# Patient Record
Sex: Female | Born: 1965 | Race: Black or African American | Hispanic: No | Marital: Single | Smoking: Former smoker
Health system: Southern US, Community
[De-identification: ages and names within clinical notes are randomized; demographics above are authoritative.]

## PROBLEM LIST (undated history)

## (undated) DIAGNOSIS — K219 Gastro-esophageal reflux disease without esophagitis: Secondary | ICD-10-CM

## (undated) DIAGNOSIS — F32A Depression, unspecified: Secondary | ICD-10-CM

## (undated) DIAGNOSIS — Z5189 Encounter for other specified aftercare: Secondary | ICD-10-CM

## (undated) DIAGNOSIS — M810 Age-related osteoporosis without current pathological fracture: Secondary | ICD-10-CM

## (undated) DIAGNOSIS — F419 Anxiety disorder, unspecified: Secondary | ICD-10-CM

## (undated) DIAGNOSIS — R7303 Prediabetes: Secondary | ICD-10-CM

## (undated) DIAGNOSIS — E119 Type 2 diabetes mellitus without complications: Secondary | ICD-10-CM

## (undated) DIAGNOSIS — J449 Chronic obstructive pulmonary disease, unspecified: Secondary | ICD-10-CM

## (undated) DIAGNOSIS — D649 Anemia, unspecified: Secondary | ICD-10-CM

## (undated) DIAGNOSIS — F329 Major depressive disorder, single episode, unspecified: Secondary | ICD-10-CM

## (undated) DIAGNOSIS — B019 Varicella without complication: Secondary | ICD-10-CM

## (undated) DIAGNOSIS — F1910 Other psychoactive substance abuse, uncomplicated: Secondary | ICD-10-CM

## (undated) HISTORY — DX: Anxiety disorder, unspecified: F41.9

## (undated) HISTORY — DX: Chronic obstructive pulmonary disease, unspecified: J44.9

## (undated) HISTORY — DX: Encounter for other specified aftercare: Z51.89

## (undated) HISTORY — DX: Other psychoactive substance abuse, uncomplicated: F19.10

## (undated) HISTORY — DX: Anemia, unspecified: D64.9

## (undated) HISTORY — DX: Varicella without complication: B01.9

## (undated) HISTORY — DX: Age-related osteoporosis without current pathological fracture: M81.0

## (undated) HISTORY — PX: COLPOSCOPY: SHX161

## (undated) HISTORY — DX: Major depressive disorder, single episode, unspecified: F32.9

## (undated) HISTORY — DX: Prediabetes: R73.03

## (undated) HISTORY — DX: Type 2 diabetes mellitus without complications: E11.9

## (undated) HISTORY — DX: Gastro-esophageal reflux disease without esophagitis: K21.9

## (undated) HISTORY — DX: Depression, unspecified: F32.A

---

## 2000-04-02 ENCOUNTER — Emergency Department (HOSPITAL_COMMUNITY): Admission: EM | Admit: 2000-04-02 | Discharge: 2000-04-02 | Payer: Self-pay | Admitting: Emergency Medicine

## 2000-09-29 ENCOUNTER — Emergency Department (HOSPITAL_COMMUNITY): Admission: EM | Admit: 2000-09-29 | Discharge: 2000-09-29 | Payer: Self-pay | Admitting: Emergency Medicine

## 2001-08-04 ENCOUNTER — Encounter: Payer: Self-pay | Admitting: Family Medicine

## 2001-08-04 ENCOUNTER — Encounter: Admission: RE | Admit: 2001-08-04 | Discharge: 2001-08-04 | Payer: Self-pay | Admitting: Family Medicine

## 2001-08-16 ENCOUNTER — Encounter: Payer: Self-pay | Admitting: Internal Medicine

## 2001-08-16 ENCOUNTER — Emergency Department (HOSPITAL_COMMUNITY): Admission: EM | Admit: 2001-08-16 | Discharge: 2001-08-16 | Payer: Self-pay | Admitting: Internal Medicine

## 2001-12-23 ENCOUNTER — Encounter: Payer: Self-pay | Admitting: Emergency Medicine

## 2001-12-23 ENCOUNTER — Emergency Department (HOSPITAL_COMMUNITY): Admission: EM | Admit: 2001-12-23 | Discharge: 2001-12-23 | Payer: Self-pay | Admitting: Emergency Medicine

## 2004-04-10 ENCOUNTER — Other Ambulatory Visit: Admission: RE | Admit: 2004-04-10 | Discharge: 2004-04-10 | Payer: Self-pay | Admitting: Obstetrics and Gynecology

## 2004-11-27 ENCOUNTER — Ambulatory Visit: Payer: Self-pay | Admitting: Internal Medicine

## 2004-12-11 ENCOUNTER — Ambulatory Visit: Payer: Self-pay | Admitting: Internal Medicine

## 2004-12-26 ENCOUNTER — Ambulatory Visit: Payer: Self-pay | Admitting: Internal Medicine

## 2005-01-19 ENCOUNTER — Ambulatory Visit: Payer: Self-pay | Admitting: Internal Medicine

## 2005-01-29 ENCOUNTER — Ambulatory Visit: Payer: Self-pay | Admitting: Internal Medicine

## 2008-09-08 ENCOUNTER — Ambulatory Visit: Payer: Self-pay | Admitting: Nurse Practitioner

## 2008-09-08 DIAGNOSIS — Z720 Tobacco use: Secondary | ICD-10-CM | POA: Insufficient documentation

## 2008-09-08 DIAGNOSIS — S46819A Strain of other muscles, fascia and tendons at shoulder and upper arm level, unspecified arm, initial encounter: Secondary | ICD-10-CM

## 2008-09-08 DIAGNOSIS — S43499A Other sprain of unspecified shoulder joint, initial encounter: Secondary | ICD-10-CM

## 2008-10-08 ENCOUNTER — Ambulatory Visit: Payer: Self-pay | Admitting: Nurse Practitioner

## 2008-10-08 ENCOUNTER — Encounter (INDEPENDENT_AMBULATORY_CARE_PROVIDER_SITE_OTHER): Payer: Self-pay | Admitting: Nurse Practitioner

## 2008-10-08 DIAGNOSIS — N760 Acute vaginitis: Secondary | ICD-10-CM | POA: Insufficient documentation

## 2008-10-08 LAB — CONVERTED CEMR LAB
ALT: <8 units/L (ref 0–35)
AST: 15 units/L (ref 0–37)
Albumin: 4 g/dL (ref 3.5–5.2)
Alkaline Phosphatase: 68 units/L (ref 39–117)
BUN: 17 mg/dL (ref 6–23)
Basophils Absolute: 0 10*3/uL (ref 0.0–0.1)
Basophils Relative: 0 % (ref 0–1)
Bilirubin Urine: NEGATIVE
Blood in Urine, dipstick: NEGATIVE
CO2: 24 meq/L (ref 19–32)
Calcium: 8.9 mg/dL (ref 8.4–10.5)
Chlamydia, DNA Probe: NEGATIVE
Chloride: 110 meq/L (ref 96–112)
Cholesterol: 157 mg/dL (ref 0–200)
Creatinine, Ser: 0.85 mg/dL (ref 0.40–1.20)
Eosinophils Absolute: 0.2 10*3/uL (ref 0.0–0.7)
Eosinophils Relative: 4 % (ref 0–5)
GC Probe Amp, Genital: NEGATIVE
Glucose, Bld: 91 mg/dL (ref 70–99)
Glucose, Urine, Semiquant: NEGATIVE
HCT: 37.4 % (ref 36.0–46.0)
HDL: 44 mg/dL (ref >39)
Hemoglobin: 12.1 g/dL (ref 12.0–15.0)
KOH Prep: NEGATIVE
Ketones, urine, test strip: NEGATIVE
LDL Cholesterol: 100 mg/dL — ABNORMAL HIGH (ref 0–99)
Lymphocytes Relative: 31 % (ref 12–46)
Lymphs Abs: 1.9 10*3/uL (ref 0.7–4.0)
MCHC: 32.4 g/dL (ref 30.0–36.0)
MCV: 90.1 fL (ref 78.0–100.0)
Monocytes Absolute: 0.5 10*3/uL (ref 0.1–1.0)
Monocytes Relative: 9 % (ref 3–12)
Neutro Abs: 3.6 10*3/uL (ref 1.7–7.7)
Neutrophils Relative %: 57 % (ref 43–77)
Nitrite: NEGATIVE
Platelets: 231 10*3/uL (ref 150–400)
Potassium: 4.6 meq/L (ref 3.5–5.3)
Protein, U semiquant: NEGATIVE
RBC: 4.15 M/uL (ref 3.87–5.11)
RDW: 15.4 % (ref 11.5–15.5)
Sodium: 143 meq/L (ref 135–145)
Specific Gravity, Urine: >=1.03
TSH: 2.311 microintl units/mL (ref 0.350–4.50)
Total Bilirubin: 0.6 mg/dL (ref 0.3–1.2)
Total CHOL/HDL Ratio: 3.6
Total Protein: 7.1 g/dL (ref 6.0–8.3)
Triglycerides: 64 mg/dL (ref <150)
Urobilinogen, UA: 0.2
VLDL: 13 mg/dL (ref 0–40)
WBC: 6.3 10*3/uL (ref 4.0–10.5)
pH: 6

## 2008-10-11 ENCOUNTER — Encounter (INDEPENDENT_AMBULATORY_CARE_PROVIDER_SITE_OTHER): Payer: Self-pay | Admitting: Nurse Practitioner

## 2008-10-11 ENCOUNTER — Telehealth (INDEPENDENT_AMBULATORY_CARE_PROVIDER_SITE_OTHER): Payer: Self-pay | Admitting: Nurse Practitioner

## 2008-10-22 ENCOUNTER — Ambulatory Visit (HOSPITAL_COMMUNITY): Admission: RE | Admit: 2008-10-22 | Discharge: 2008-10-22 | Payer: Self-pay | Admitting: Internal Medicine

## 2009-08-23 ENCOUNTER — Emergency Department (HOSPITAL_COMMUNITY): Admission: EM | Admit: 2009-08-23 | Discharge: 2009-08-23 | Payer: Self-pay | Admitting: Emergency Medicine

## 2009-08-25 ENCOUNTER — Ambulatory Visit: Payer: Self-pay | Admitting: Nurse Practitioner

## 2009-08-25 DIAGNOSIS — J329 Chronic sinusitis, unspecified: Secondary | ICD-10-CM | POA: Insufficient documentation

## 2009-08-25 DIAGNOSIS — K143 Hypertrophy of tongue papillae: Secondary | ICD-10-CM

## 2009-10-11 ENCOUNTER — Ambulatory Visit (HOSPITAL_COMMUNITY): Admission: RE | Admit: 2009-10-11 | Discharge: 2009-10-11 | Payer: Self-pay | Admitting: Internal Medicine

## 2009-10-19 ENCOUNTER — Ambulatory Visit: Payer: Self-pay | Admitting: Nurse Practitioner

## 2009-10-19 ENCOUNTER — Encounter (INDEPENDENT_AMBULATORY_CARE_PROVIDER_SITE_OTHER): Payer: Self-pay | Admitting: Nurse Practitioner

## 2009-10-19 DIAGNOSIS — H6120 Impacted cerumen, unspecified ear: Secondary | ICD-10-CM

## 2009-10-19 LAB — CONVERTED CEMR LAB
ALT: 13 units/L (ref 0–35)
BUN: 15 mg/dL (ref 6–23)
Basophils Relative: 1 % (ref 0–1)
Bilirubin Urine: NEGATIVE
Blood in Urine, dipstick: NEGATIVE
CO2: 24 meq/L (ref 19–32)
Calcium: 9.7 mg/dL (ref 8.4–10.5)
Chloride: 106 meq/L (ref 96–112)
Cholesterol: 189 mg/dL (ref 0–200)
Creatinine, Ser: 0.84 mg/dL (ref 0.40–1.20)
Eosinophils Absolute: 0.1 10*3/uL (ref 0.0–0.7)
Eosinophils Relative: 2 % (ref 0–5)
Glucose, Bld: 85 mg/dL (ref 70–99)
Glucose, Urine, Semiquant: NEGATIVE
HCT: 36.1 % (ref 36.0–46.0)
HDL: 56 mg/dL (ref >39)
KOH Prep: NEGATIVE
Ketones, urine, test strip: NEGATIVE
Lymphs Abs: 2.7 10*3/uL (ref 0.7–4.0)
MCHC: 33 g/dL (ref 30.0–36.0)
MCV: 88.3 fL (ref 78.0–100.0)
Monocytes Absolute: 0.4 10*3/uL (ref 0.1–1.0)
Monocytes Relative: 7 % (ref 3–12)
Neutrophils Relative %: 48 % (ref 43–77)
Nitrite: NEGATIVE
OCCULT 1: NEGATIVE
Protein, U semiquant: NEGATIVE
RBC: 4.09 M/uL (ref 3.87–5.11)
Specific Gravity, Urine: 1.025
Total Bilirubin: 0.5 mg/dL (ref 0.3–1.2)
Total CHOL/HDL Ratio: 3.4
Triglycerides: 45 mg/dL (ref <150)
Urobilinogen, UA: 0.2
VLDL: 9 mg/dL (ref 0–40)
WBC Urine, dipstick: NEGATIVE
WBC: 6.2 10*3/uL (ref 4.0–10.5)
pH: 5

## 2009-10-20 ENCOUNTER — Encounter (INDEPENDENT_AMBULATORY_CARE_PROVIDER_SITE_OTHER): Payer: Self-pay | Admitting: Nurse Practitioner

## 2009-10-24 ENCOUNTER — Ambulatory Visit: Payer: Self-pay | Admitting: Nurse Practitioner

## 2010-03-30 ENCOUNTER — Ambulatory Visit: Payer: Self-pay | Admitting: Nurse Practitioner

## 2010-03-30 DIAGNOSIS — L83 Acanthosis nigricans: Secondary | ICD-10-CM

## 2010-03-30 DIAGNOSIS — L0100 Impetigo, unspecified: Secondary | ICD-10-CM

## 2010-04-24 ENCOUNTER — Ambulatory Visit: Payer: Self-pay | Admitting: Physician Assistant

## 2010-04-24 ENCOUNTER — Encounter (INDEPENDENT_AMBULATORY_CARE_PROVIDER_SITE_OTHER): Payer: Self-pay | Admitting: Nurse Practitioner

## 2010-04-24 DIAGNOSIS — L259 Unspecified contact dermatitis, unspecified cause: Secondary | ICD-10-CM

## 2010-04-24 DIAGNOSIS — K029 Dental caries, unspecified: Secondary | ICD-10-CM | POA: Insufficient documentation

## 2011-01-23 NOTE — Letter (Signed)
Summary: PT INFORMATION SHEET  PT INFORMATION SHEET   Imported By: Arta Bruce 06/21/2010 14:51:14  _____________________________________________________________________  External Attachment:    Type:   Image     Comment:   External Document

## 2011-01-23 NOTE — Assessment & Plan Note (Signed)
Summary: tright hand infection//gk   Vital Signs:  Patient profile:   45 year old female Menstrual status:  regular Height:      61 inches Weight:      210 pounds BMI:     39.82 Temp:     97.9 degrees F oral Pulse rate:   78 / minute Pulse rhythm:   regular Resp:     18 per minute BP sitting:   126 / 85  (left arm) Cuff size:   large  Vitals Entered By: [place] Shannon (Apr 24, 2010 11:54 AM) CC: pt is here for right hand infection on thumb... Is Patient Diabetic? No Pain Assessment Patient in pain? no       Does patient need assistance? Functional Status Self care Ambulation Normal   Primary Care Provider:  Lehman Prom FNP  CC:  pt is here for right hand infection on thumb....  History of Present Illness: Here for rash on right thumb.  Dorma Russell, NP recently and given mupirocin for impetigo.  Patient notes rash seemed to get better.  But, she started to notice pruritus again a few days ago.  The pruritus started before the rash broke out.  She has been wearing latex gloves at work.  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  She denies having this before she was initially seen.  She denies any fevers.  She does have allergies and she usually takes OTC zyrtec.  She has been taking benadryl with this and it does help the itching.  She seems to have worsening of symptoms at bedtime.    She would also like a dental referral.  She has had one tooth fall out recently and has some molars that are starting to get loose as well.  No facial pain.  Current Medications (verified): 1)  Ibuprofen 800 Mg Tabs (Ibuprofen) .Marland Kitchen.. 1 Tablet By Mouth Two Times A Day As Needed For Pain 2)  Bactroban 2 % Oint (Mupirocin) .... Apply Three Times A Day To Affected Area For 5 Days  Allergies (verified): No Known Drug Allergies  Physical Exam  General:  alert, well-developed, and well-nourished.   Head:  normocephalic and atraumatic.   Mouth:  poor dentition and teeth missing.   Neck:  supple.   Lungs:   normal breath sounds.   Heart:  normal rate and regular rhythm.   Neurologic:  alert & oriented X3 and cranial nerves II-XII intact.   Skin:  maculopapular plaque on proximal phalanx of right thumb some excoriation noted no honey crusted lesions noted no other lesions noted at elbows or other hand  Psych:  normally interactive.     Impression & Recommendations:  Problem # 1:  ECZEMA (ICD-692.9)  suspect she had secondary infection when she initially presented and mupirocin seemed to help with h/o allergic rhinitis she would be at risk for eczema does not look infected today start triamcinolone x 2 weeks discussed using soaps without perfumes or dyes can use Aveeno for moisturizer f/u as needed  Her updated medication list for this problem includes:    Triamcinolone Acetonide 0.1 % Oint (Triamcinolone acetonide) .Marland Kitchen... Apply two times a day for one week; then apply once daily for one week; then stop  Problem # 2:  DENTAL CARIES (ICD-521.00)  Orders: Dental Referral (Dentist)  Complete Medication List: 1)  Ibuprofen 800 Mg Tabs (Ibuprofen) .Marland Kitchen.. 1 tablet by mouth two times a day as needed for pain 2)  Bactroban 2 % Oint (Mupirocin) .... Apply  three times a day to affected area for 5 days 3)  Triamcinolone Acetonide 0.1 % Oint (Triamcinolone acetonide) .... Apply two times a day for one week; then apply once daily for one week; then stop  Patient Instructions: 1)  Use the triamcinolone two times a day for a week, then once daily for a week, then stop. 2)  If rash gets worse, stop the triamcinolone and call us. 3)  Keep area clean.  If you begin to notice the yellow crusting or increased redness or yellowish drainage like you had before, you can use the mupirocin again.  Just do not apply it at the same time as the Triamcinolone. 4)  We will put in a dental referral for you. Prescriptions: TRIAMCINOLONE ACETONIDE 0.1 % OINT (TRIAMCINOLONE ACETONIDE) apply two times a day for one  week; then apply once daily for one week; then stop  #30 grams x 1   Entered and Authorized by:   Tereso Newcomer PA-C   Signed by:   Tereso Newcomer PA-C on 04/24/2010   Method used:   Print then Give to Patient   RxID:   [id number]

## 2011-01-23 NOTE — Assessment & Plan Note (Signed)
Summary: Acute - Impetigo   Vital Signs:  Patient profile:   45 year old female Menstrual status:  regular Weight:      206.7 pounds Temp:     97.6 degrees F oral Pulse rate:   86 / minute Pulse rhythm:   regular Resp:     20 per minute BP sitting:   120 / 82  (left arm) Cuff size:   large  Vitals Entered By: Levon Hedger (March 30, 2010 3:16 PM) CC: irritation on right thumb x 1 month ago...dark spot on elbow Is Patient Diabetic? No  Does patient need assistance? Functional Status Self care Ambulation Normal   CC:  irritation on right thumb x 1 month ago...dark spot on elbow.  History of Present Illness:  Pt into the office with complaints of bumps on her right thumb  started 1 month ago no progression over the past month but still present no other body parts affected Denies any trauma or insect bites.   Does not use gloves when washing dishes or household cleaning Puritis and she has been scratching the areas with her long fingernails no erythema blistered and crusted appearance she has been applying over the counter antibiotic ointment to the area again without improvement Pt is employed in a day care.  right elbow - discoloration with right elbow.   just noticed a few weeks ago. she has tried to clean the area but has realized that area is not due to hygiene. no pain  no puritis just appearance is the main concern  Habits & Providers  Alcohol-Tobacco-Diet     Alcohol drinks/day: <1     Alcohol Counseling: not indicated; patient does not drink     Tobacco Status: current     Tobacco Counseling: to quit use of tobacco products     Cigarette Packs/Day: 3 cigs     Year Started: quit 12/08 and restarted 4/09  Exercise-Depression-Behavior     Does Patient Exercise: no     Exercise Counseling: to improve exercise regimen     Have you felt down or hopeless? no     Have you felt little pleasure in things? no     Depression Counseling: not indicated;  screening negative for depression     Drug Use: past     Seat Belt Use: 100     Sun Exposure: occasionally  Allergies (verified): No Known Drug Allergies  Review of Systems Resp:  Denies cough. GI:  Denies abdominal pain, nausea, and vomiting. Derm:  Complains of rash.  Physical Exam  General:  alert.  obese Head:  normocephalic.   Eyes:  glasses Msk:  up to the exam table Neurologic:  alert & oriented X3.   Skin:  right thumb DIP - crusted lesions  right elbow - velvety, black appearance not present on left elbow Psych:  Oriented X3.     Impression & Recommendations:  Problem # 1:  IMPETIGO (ICD-684) handout given pt to apply ointment to affected area wear gloves when doing household chores  Problem # 2:  ACANTHOSIS NIGRICANS (ICD-701.2) handout given advised pt this is not due to hygiene  Problem # 3:  OBESITY (ICD-278.00) weight gain since last visit advised pt that she need to increase her physical activity  Complete Medication List: 1)  Ibuprofen 800 Mg Tabs (Ibuprofen) .Marland Kitchen.. 1 tablet by mouth two times a day as needed for pain 2)  Bactroban 2 % Oint (Mupirocin) .... Apply three times a day to affected area  for 5 days  Patient Instructions: 1)  Read handouts 2)  Right thumb - impetigo.  Apply ointment to affected areas until healed. 3)  Wear gloves when doing household chores 4)  Follow up as needed or sooner if symptoms continue Prescriptions: BACTROBAN 2 % OINT (MUPIROCIN) Apply three times a day to affected area for 5 days  #15gm x 0   Entered and Authorized by:   Lehman Prom FNP   Signed by:   Lehman Prom FNP on 03/30/2010   Method used:   Print then Give to Patient   RxID:   [id number]

## 2011-01-23 NOTE — Letter (Signed)
Summary: DENTAL REFERRAL  DENTAL REFERRAL   Imported By: Arta Bruce 06/21/2010 14:46:42  _____________________________________________________________________  External Attachment:    Type:   Image     Comment:   External Document

## 2013-07-08 ENCOUNTER — Other Ambulatory Visit (INDEPENDENT_AMBULATORY_CARE_PROVIDER_SITE_OTHER): Payer: No Typology Code available for payment source

## 2013-07-08 ENCOUNTER — Ambulatory Visit (INDEPENDENT_AMBULATORY_CARE_PROVIDER_SITE_OTHER): Payer: No Typology Code available for payment source | Admitting: Internal Medicine

## 2013-07-08 ENCOUNTER — Encounter: Payer: Self-pay | Admitting: Internal Medicine

## 2013-07-08 VITALS — BP 118/82 | HR 80 | Temp 98.1°F | Resp 16 | Ht 59.0 in | Wt 199.0 lb

## 2013-07-08 DIAGNOSIS — F17200 Nicotine dependence, unspecified, uncomplicated: Secondary | ICD-10-CM

## 2013-07-08 DIAGNOSIS — Z0000 Encounter for general adult medical examination without abnormal findings: Secondary | ICD-10-CM | POA: Insufficient documentation

## 2013-07-08 DIAGNOSIS — Z1231 Encounter for screening mammogram for malignant neoplasm of breast: Secondary | ICD-10-CM | POA: Insufficient documentation

## 2013-07-08 DIAGNOSIS — L259 Unspecified contact dermatitis, unspecified cause: Secondary | ICD-10-CM

## 2013-07-08 LAB — LIPID PANEL
Cholesterol: 188 mg/dL (ref 0–200)
LDL Cholesterol: 125 mg/dL — ABNORMAL HIGH (ref 0–99)
Triglycerides: 80 mg/dL (ref 0.0–149.0)

## 2013-07-08 LAB — CBC WITH DIFFERENTIAL/PLATELET
Basophils Absolute: 0.1 10*3/uL (ref 0.0–0.1)
Eosinophils Absolute: 0.1 10*3/uL (ref 0.0–0.7)
HCT: 37.7 % (ref 36.0–46.0)
Hemoglobin: 12.5 g/dL (ref 12.0–15.0)
Lymphs Abs: 1.6 10*3/uL (ref 0.7–4.0)
MCHC: 33.1 g/dL (ref 30.0–36.0)
MCV: 88.8 fl (ref 78.0–100.0)
Neutro Abs: 4.7 10*3/uL (ref 1.4–7.7)
RDW: 14.5 % (ref 11.5–14.6)

## 2013-07-08 LAB — URINALYSIS, ROUTINE W REFLEX MICROSCOPIC
Hgb urine dipstick: NEGATIVE
Total Protein, Urine: NEGATIVE
Urine Glucose: NEGATIVE

## 2013-07-08 LAB — COMPREHENSIVE METABOLIC PANEL
ALT: 11 U/L (ref 0–35)
AST: 14 U/L (ref 0–37)
Creatinine, Ser: 0.8 mg/dL (ref 0.4–1.2)
GFR: 103.78 mL/min (ref >60.00)
Total Bilirubin: 0.4 mg/dL (ref 0.3–1.2)

## 2013-07-08 MED ORDER — TRIAMCINOLONE ACETONIDE 0.5 % EX CREA: TOPICAL_CREAM | Freq: Three times a day (TID) | CUTANEOUS | Status: DC

## 2013-07-08 NOTE — Progress Notes (Signed)
Subjective:    Patient ID: Kylie Brennan, female    DOB: 1966-11-17, 48 y.o.   MRN: [id number]  Rash This is a chronic problem. The current episode started more than 1 month ago. The problem is unchanged. The affected locations include the neck, left arm and right arm. The rash is characterized by itchiness and dryness. She was exposed to nothing. Pertinent negatives include no anorexia, congestion, cough, diarrhea, eye pain, facial edema, fatigue, fever, joint pain, nail changes, rhinorrhea, shortness of breath, sore throat or vomiting. Past treatments include antihistamine. The treatment provided mild relief. Her past medical history is significant for eczema. There is no history of allergies, asthma or varicella.      Review of Systems  Constitutional: Negative.  Negative for fever, chills, diaphoresis, activity change, appetite change, fatigue and unexpected weight change.  HENT: Negative.  Negative for congestion, sore throat and rhinorrhea.   Eyes: Negative.  Negative for pain.  Respiratory: Negative.  Negative for cough, chest tightness, shortness of breath, wheezing and stridor.   Cardiovascular: Negative.  Negative for chest pain, palpitations and leg swelling.  Gastrointestinal: Negative.  Negative for nausea, vomiting, abdominal pain, diarrhea, blood in stool and anorexia.  Endocrine: Negative.   Genitourinary: Negative.   Musculoskeletal: Negative.  Negative for joint pain.  Skin: Positive for rash. Negative for nail changes, color change, pallor and wound.  Allergic/Immunologic: Negative.   Neurological: Negative.   Hematological: Negative.  Negative for adenopathy. Does not bruise/bleed easily.  Psychiatric/Behavioral: Negative.        Objective:   Physical Exam  Vitals reviewed. Constitutional: She is oriented to person, place, and time. She appears well-developed and well-nourished. No distress.  HENT:  Head: Normocephalic and atraumatic.  Mouth/Throat: Oropharynx is  clear and moist. No oropharyngeal exudate.  Eyes: Conjunctivae are normal. Right eye exhibits no discharge. Left eye exhibits no discharge. No scleral icterus.  Neck: Normal range of motion. Neck supple. No JVD present. No tracheal deviation present. No thyromegaly present.  Cardiovascular: Normal rate, regular rhythm, normal heart sounds and intact distal pulses.  Exam reveals no gallop and no friction rub.   No murmur heard. Pulmonary/Chest: Effort normal and breath sounds normal. No stridor. No respiratory distress. She has no wheezes. She has no rales. She exhibits no tenderness.  Abdominal: Soft. Bowel sounds are normal. She exhibits no distension and no mass. There is no tenderness. There is no rebound and no guarding.  Musculoskeletal: Normal range of motion. She exhibits no edema and no tenderness.  Lymphadenopathy:    She has no cervical adenopathy.  Neurological: She is oriented to person, place, and time.  Skin: Skin is warm, dry and intact. Rash noted. No lesion and no purpura noted. Rash is papular. Rash is not macular, not maculopapular, not nodular, not pustular, not vesicular and not urticarial. She is not diaphoretic. No cyanosis or erythema. No pallor. Nails show no clubbing.  She has rare scattered scaly papules over the back of her neck and over both upper arms  Psychiatric: She has a normal mood and affect. Her behavior is normal. Judgment and thought content normal.     Lab Results  Component Value Date   WBC 6.2 10/19/2009   HGB 11.9* 10/19/2009   HCT 36.1 10/19/2009   PLT 258 10/19/2009   GLUCOSE 85 10/19/2009   CHOL 189 10/19/2009   TRIG 45 10/19/2009   HDL 56 10/19/2009   LDLCALC 124* 10/19/2009   ALT 13 10/19/2009   AST 16  10/19/2009   NA 141 10/19/2009   K 4.9 10/19/2009   CL 106 10/19/2009   CREATININE 0.84 10/19/2009   BUN 15 10/19/2009   CO2 24 10/19/2009   TSH 2.407 10/19/2009       Assessment & Plan:

## 2013-07-08 NOTE — Patient Instructions (Signed)
Preventive Care for Adults, Female A healthy lifestyle and preventive care can promote health and wellness. Preventive health guidelines for women include the following key practices.  A routine yearly physical is a good way to check with your caregiver about your health and preventive screening. It is a chance to share any concerns and updates on your health, and to receive a thorough exam.  Visit your dentist for a routine exam and preventive care every 6 months. Brush your teeth twice a day and floss once a day. Good oral hygiene prevents tooth decay and gum disease.  The frequency of eye exams is based on your age, health, family medical history, use of contact lenses, and other factors. Follow your caregiver's recommendations for frequency of eye exams.  Eat a healthy diet. Foods like vegetables, fruits, whole grains, low-fat dairy products, and lean protein foods contain the nutrients you need without too many calories. Decrease your intake of foods high in solid fats, added sugars, and salt. Eat the right amount of calories for you.Get information about a proper diet from your caregiver, if necessary.  Regular physical exercise is one of the most important things you can do for your health. Most adults should get at least 150 minutes of moderate-intensity exercise (any activity that increases your heart rate and causes you to sweat) each week. In addition, most adults need muscle-strengthening exercises on 2 or more days a week.  Maintain a healthy weight. The body mass index (BMI) is a screening tool to identify possible weight problems. It provides an estimate of body fat based on height and weight. Your caregiver can help determine your BMI, and can help you achieve or maintain a healthy weight.For adults 20 years and older:  A BMI below 18.5 is considered underweight.  A BMI of 18.5 to 24.9 is normal.  A BMI of 25 to 29.9 is considered overweight.  A BMI of 30 and above is  considered obese.  Maintain normal blood lipids and cholesterol levels by exercising and minimizing your intake of saturated fat. Eat a balanced diet with plenty of fruit and vegetables. Blood tests for lipids and cholesterol should begin at age 20 and be repeated every 5 years. If your lipid or cholesterol levels are high, you are over 50, or you are at high risk for heart disease, you may need your cholesterol levels checked more frequently.Ongoing high lipid and cholesterol levels should be treated with medicines if diet and exercise are not effective.  If you smoke, find out from your caregiver how to quit. If you do not use tobacco, do not start.  If you are pregnant, do not drink alcohol. If you are breastfeeding, be very cautious about drinking alcohol. If you are not pregnant and choose to drink alcohol, do not exceed 1 drink per day. One drink is considered to be 12 ounces (355 mL) of beer, 5 ounces (148 mL) of wine, or 1.5 ounces (44 mL) of liquor.  Avoid use of street drugs. Do not share needles with anyone. Ask for help if you need support or instructions about stopping the use of drugs.  High blood pressure causes heart disease and increases the risk of stroke. Your blood pressure should be checked at least every 1 to 2 years. Ongoing high blood pressure should be treated with medicines if weight loss and exercise are not effective.  If you are 55 to 46 years old, ask your caregiver if you should take aspirin to prevent strokes.  Diabetes   screening involves taking a blood sample to check your fasting blood sugar level. This should be done once every 3 years, after age 45, if you are within normal weight and without risk factors for diabetes. Testing should be considered at a younger age or be carried out more frequently if you are overweight and have at least 1 risk factor for diabetes.  Breast cancer screening is essential preventive care for women. You should practice "breast  self-awareness." This means understanding the normal appearance and feel of your breasts and may include breast self-examination. Any changes detected, no matter how small, should be reported to a caregiver. Women in their 20s and 30s should have a clinical breast exam (CBE) by a caregiver as part of a regular health exam every 1 to 3 years. After age 40, women should have a CBE every year. Starting at age 40, women should consider having a mammography (breast X-ray test) every year. Women who have a family history of breast cancer should talk to their caregiver about genetic screening. Women at a high risk of breast cancer should talk to their caregivers about having magnetic resonance imaging (MRI) and a mammography every year.  The Pap test is a screening test for cervical cancer. A Pap test can show cell changes on the cervix that might become cervical cancer if left untreated. A Pap test is a procedure in which cells are obtained and examined from the lower end of the uterus (cervix).  Women should have a Pap test starting at age 21.  Between ages 21 and 29, Pap tests should be repeated every 2 years.  Beginning at age 30, you should have a Pap test every 3 years as long as the past 3 Pap tests have been normal.  Some women have medical problems that increase the chance of getting cervical cancer. Talk to your caregiver about these problems. It is especially important to talk to your caregiver if a new problem develops soon after your last Pap test. In these cases, your caregiver may recommend more frequent screening and Pap tests.  The above recommendations are the same for women who have or have not gotten the vaccine for human papillomavirus (HPV).  If you had a hysterectomy for a problem that was not cancer or a condition that could lead to cancer, then you no longer need Pap tests. Even if you no longer need a Pap test, a regular exam is a good idea to make sure no other problems are  starting.  If you are between ages 65 and 70, and you have had normal Pap tests going back 10 years, you no longer need Pap tests. Even if you no longer need a Pap test, a regular exam is a good idea to make sure no other problems are starting.  If you have had past treatment for cervical cancer or a condition that could lead to cancer, you need Pap tests and screening for cancer for at least 20 years after your treatment.  If Pap tests have been discontinued, risk factors (such as a new sexual partner) need to be reassessed to determine if screening should be resumed.  The HPV test is an additional test that may be used for cervical cancer screening. The HPV test looks for the virus that can cause the cell changes on the cervix. The cells collected during the Pap test can be tested for HPV. The HPV test could be used to screen women aged 30 years and older, and should   be used in women of any age who have unclear Pap test results. After the age of 30, women should have HPV testing at the same frequency as a Pap test.  Colorectal cancer can be detected and often prevented. Most routine colorectal cancer screening begins at the age of 50 and continues through age 75. However, your caregiver may recommend screening at an earlier age if you have risk factors for colon cancer. On a yearly basis, your caregiver may provide home test kits to check for hidden blood in the stool. Use of a small camera at the end of a tube, to directly examine the colon (sigmoidoscopy or colonoscopy), can detect the earliest forms of colorectal cancer. Talk to your caregiver about this at age 50, when routine screening begins. Direct examination of the colon should be repeated every 5 to 10 years through age 75, unless early forms of pre-cancerous polyps or small growths are found.  Hepatitis C blood testing is recommended for all people born from 1945 through 1965 and any individual with known risks for hepatitis C.  Practice  safe sex. Use condoms and avoid high-risk sexual practices to reduce the spread of sexually transmitted infections (STIs). STIs include gonorrhea, chlamydia, syphilis, trichomonas, herpes, HPV, and human immunodeficiency virus (HIV). Herpes, HIV, and HPV are viral illnesses that have no cure. They can result in disability, cancer, and death. Sexually active women aged 25 and younger should be checked for chlamydia. Older women with new or multiple partners should also be tested for chlamydia. Testing for other STIs is recommended if you are sexually active and at increased risk.  Osteoporosis is a disease in which the bones lose minerals and strength with aging. This can result in serious bone fractures. The risk of osteoporosis can be identified using a bone density scan. Women ages 65 and over and women at risk for fractures or osteoporosis should discuss screening with their caregivers. Ask your caregiver whether you should take a calcium supplement or vitamin D to reduce the rate of osteoporosis.  Menopause can be associated with physical symptoms and risks. Hormone replacement therapy is available to decrease symptoms and risks. You should talk to your caregiver about whether hormone replacement therapy is right for you.  Use sunscreen with sun protection factor (SPF) of 30 or more. Apply sunscreen liberally and repeatedly throughout the day. You should seek shade when your shadow is shorter than you. Protect yourself by wearing long sleeves, pants, a wide-brimmed hat, and sunglasses year round, whenever you are outdoors.  Once a month, do a whole body skin exam, using a mirror to look at the skin on your back. Notify your caregiver of new moles, moles that have irregular borders, moles that are larger than a pencil eraser, or moles that have changed in shape or color.  Stay current with required immunizations.  Influenza. You need a dose every fall (or winter). The composition of the flu vaccine  changes each year, so being vaccinated once is not enough.  Pneumococcal polysaccharide. You need 1 to 2 doses if you smoke cigarettes or if you have certain chronic medical conditions. You need 1 dose at age 65 (or older) if you have never been vaccinated.  Tetanus, diphtheria, pertussis (Tdap, Td). Get 1 dose of Tdap vaccine if you are younger than age 65, are over 65 and have contact with an infant, are a healthcare worker, are pregnant, or simply want to be protected from whooping cough. After that, you need a Td   booster dose every 10 years. Consult your caregiver if you have not had at least 3 tetanus and diphtheria-containing shots sometime in your life or have a deep or dirty wound.  HPV. You need this vaccine if you are a woman age 26 or younger. The vaccine is given in 3 doses over 6 months.  Measles, mumps, rubella (MMR). You need at least 1 dose of MMR if you were born in 1957 or later. You may also need a second dose.  Meningococcal. If you are age 19 to 21 and a first-year college student living in a residence hall, or have one of several medical conditions, you need to get vaccinated against meningococcal disease. You may also need additional booster doses.  Zoster (shingles). If you are age 60 or older, you should get this vaccine.  Varicella (chickenpox). If you have never had chickenpox or you were vaccinated but received only 1 dose, talk to your caregiver to find out if you need this vaccine.  Hepatitis A. You need this vaccine if you have a specific risk factor for hepatitis A virus infection or you simply wish to be protected from this disease. The vaccine is usually given as 2 doses, 6 to 18 months apart.  Hepatitis B. You need this vaccine if you have a specific risk factor for hepatitis B virus infection or you simply wish to be protected from this disease. The vaccine is given in 3 doses, usually over 6 months. Preventive Services / Frequency Ages 19 to 39  Blood  pressure check.** / Every 1 to 2 years.  Lipid and cholesterol check.** / Every 5 years beginning at age 20.  Clinical breast exam.** / Every 3 years for women in their 20s and 30s.  Pap test.** / Every 2 years from ages 21 through 29. Every 3 years starting at age 30 through age 65 or 70 with a history of 3 consecutive normal Pap tests.  HPV screening.** / Every 3 years from ages 30 through ages 65 to 70 with a history of 3 consecutive normal Pap tests.  Hepatitis C blood test.** / For any individual with known risks for hepatitis C.  Skin self-exam. / Monthly.  Influenza immunization.** / Every year.  Pneumococcal polysaccharide immunization.** / 1 to 2 doses if you smoke cigarettes or if you have certain chronic medical conditions.  Tetanus, diphtheria, pertussis (Tdap, Td) immunization. / A one-time dose of Tdap vaccine. After that, you need a Td booster dose every 10 years.  HPV immunization. / 3 doses over 6 months, if you are 26 and younger.  Measles, mumps, rubella (MMR) immunization. / You need at least 1 dose of MMR if you were born in 1957 or later. You may also need a second dose.  Meningococcal immunization. / 1 dose if you are age 19 to 21 and a first-year college student living in a residence hall, or have one of several medical conditions, you need to get vaccinated against meningococcal disease. You may also need additional booster doses.  Varicella immunization.** / Consult your caregiver.  Hepatitis A immunization.** / Consult your caregiver. 2 doses, 6 to 18 months apart.  Hepatitis B immunization.** / Consult your caregiver. 3 doses usually over 6 months. Ages 40 to 64  Blood pressure check.** / Every 1 to 2 years.  Lipid and cholesterol check.** / Every 5 years beginning at age 20.  Clinical breast exam.** / Every year after age 40.  Mammogram.** / Every year beginning at age 40   and continuing for as long as you are in good health. Consult with your  caregiver.  Pap test.** / Every 3 years starting at age 30 through age 65 or 70 with a history of 3 consecutive normal Pap tests.  HPV screening.** / Every 3 years from ages 30 through ages 65 to 70 with a history of 3 consecutive normal Pap tests.  Fecal occult blood test (FOBT) of stool. / Every year beginning at age 50 and continuing until age 75. You may not need to do this test if you get a colonoscopy every 10 years.  Flexible sigmoidoscopy or colonoscopy.** / Every 5 years for a flexible sigmoidoscopy or every 10 years for a colonoscopy beginning at age 50 and continuing until age 75.  Hepatitis C blood test.** / For all people born from 1945 through 1965 and any individual with known risks for hepatitis C.  Skin self-exam. / Monthly.  Influenza immunization.** / Every year.  Pneumococcal polysaccharide immunization.** / 1 to 2 doses if you smoke cigarettes or if you have certain chronic medical conditions.  Tetanus, diphtheria, pertussis (Tdap, Td) immunization.** / A one-time dose of Tdap vaccine. After that, you need a Td booster dose every 10 years.  Measles, mumps, rubella (MMR) immunization. / You need at least 1 dose of MMR if you were born in 1957 or later. You may also need a second dose.  Varicella immunization.** / Consult your caregiver.  Meningococcal immunization.** / Consult your caregiver.  Hepatitis A immunization.** / Consult your caregiver. 2 doses, 6 to 18 months apart.  Hepatitis B immunization.** / Consult your caregiver. 3 doses, usually over 6 months. Ages 65 and over  Blood pressure check.** / Every 1 to 2 years.  Lipid and cholesterol check.** / Every 5 years beginning at age 20.  Clinical breast exam.** / Every year after age 40.  Mammogram.** / Every year beginning at age 40 and continuing for as long as you are in good health. Consult with your caregiver.  Pap test.** / Every 3 years starting at age 30 through age 65 or 70 with a 3  consecutive normal Pap tests. Testing can be stopped between 65 and 70 with 3 consecutive normal Pap tests and no abnormal Pap or HPV tests in the past 10 years.  HPV screening.** / Every 3 years from ages 30 through ages 65 or 70 with a history of 3 consecutive normal Pap tests. Testing can be stopped between 65 and 70 with 3 consecutive normal Pap tests and no abnormal Pap or HPV tests in the past 10 years.  Fecal occult blood test (FOBT) of stool. / Every year beginning at age 50 and continuing until age 75. You may not need to do this test if you get a colonoscopy every 10 years.  Flexible sigmoidoscopy or colonoscopy.** / Every 5 years for a flexible sigmoidoscopy or every 10 years for a colonoscopy beginning at age 50 and continuing until age 75.  Hepatitis C blood test.** / For all people born from 1945 through 1965 and any individual with known risks for hepatitis C.  Osteoporosis screening.** / A one-time screening for women ages 65 and over and women at risk for fractures or osteoporosis.  Skin self-exam. / Monthly.  Influenza immunization.** / Every year.  Pneumococcal polysaccharide immunization.** / 1 dose at age 65 (or older) if you have never been vaccinated.  Tetanus, diphtheria, pertussis (Tdap, Td) immunization. / A one-time dose of Tdap vaccine if you are over   65 and have contact with an infant, are a healthcare worker, or simply want to be protected from whooping cough. After that, you need a Td booster dose every 10 years.  Varicella immunization.** / Consult your caregiver.  Meningococcal immunization.** / Consult your caregiver.  Hepatitis A immunization.** / Consult your caregiver. 2 doses, 6 to 18 months apart.  Hepatitis B immunization.** / Check with your caregiver. 3 doses, usually over 6 months. ** Family history and personal history of risk and conditions may change your caregiver's recommendations. Document Released: 02/05/2002 Document Revised: 03/03/2012  Document Reviewed: 05/07/2011 [employer] Patient Information 2014 [employer].  

## 2013-07-08 NOTE — Assessment & Plan Note (Signed)
She is willing to work on her lifestyle modifications to lose weight

## 2013-07-08 NOTE — Assessment & Plan Note (Signed)
She refused all vaccines today No PAP done at her request Exam done Labs ordered Pt ed material was given

## 2013-07-08 NOTE — Assessment & Plan Note (Signed)
She will try TAC cream

## 2013-07-08 NOTE — Assessment & Plan Note (Signed)
She is not ready to quit smoking 

## 2013-07-09 ENCOUNTER — Encounter: Payer: Self-pay | Admitting: Internal Medicine

## 2013-07-16 ENCOUNTER — Ambulatory Visit (HOSPITAL_COMMUNITY)
Admission: RE | Admit: 2013-07-16 | Discharge: 2013-07-16 | Disposition: A | Discharge status: Home / Self Care | Payer: No Typology Code available for payment source | Source: Ambulatory Visit | Attending: Internal Medicine | Admitting: Internal Medicine

## 2013-07-16 DIAGNOSIS — Z1231 Encounter for screening mammogram for malignant neoplasm of breast: Secondary | ICD-10-CM | POA: Insufficient documentation

## 2013-07-17 LAB — HM MAMMOGRAPHY: HM Mammogram: NORMAL

## 2014-07-14 ENCOUNTER — Other Ambulatory Visit: Payer: Self-pay | Admitting: Internal Medicine

## 2014-07-14 DIAGNOSIS — Z1231 Encounter for screening mammogram for malignant neoplasm of breast: Secondary | ICD-10-CM

## 2014-08-02 ENCOUNTER — Ambulatory Visit (HOSPITAL_COMMUNITY)
Admission: RE | Admit: 2014-08-02 | Discharge: 2014-08-02 | Disposition: A | Discharge status: Home / Self Care | Payer: No Typology Code available for payment source | Source: Ambulatory Visit | Attending: Internal Medicine | Admitting: Internal Medicine

## 2014-08-02 DIAGNOSIS — Z1231 Encounter for screening mammogram for malignant neoplasm of breast: Secondary | ICD-10-CM | POA: Insufficient documentation

## 2014-08-03 LAB — HM MAMMOGRAPHY: HM Mammogram: NORMAL

## 2015-04-06 ENCOUNTER — Encounter: Payer: No Typology Code available for payment source | Admitting: Obstetrics and Gynecology

## 2016-04-02 ENCOUNTER — Encounter: Payer: Self-pay | Admitting: Obstetrics and Gynecology

## 2016-04-18 ENCOUNTER — Ambulatory Visit (INDEPENDENT_AMBULATORY_CARE_PROVIDER_SITE_OTHER): Payer: 59 | Admitting: Obstetrics and Gynecology

## 2016-04-18 ENCOUNTER — Encounter: Payer: Self-pay | Admitting: Obstetrics and Gynecology

## 2016-04-18 VITALS — BP 138/80 | HR 80 | Resp 14 | Ht 59.5 in | Wt 199.0 lb

## 2016-04-18 DIAGNOSIS — Z0000 Encounter for general adult medical examination without abnormal findings: Secondary | ICD-10-CM | POA: Diagnosis not present

## 2016-04-18 DIAGNOSIS — Z01419 Encounter for gynecological examination (general) (routine) without abnormal findings: Secondary | ICD-10-CM | POA: Diagnosis not present

## 2016-04-18 DIAGNOSIS — Z124 Encounter for screening for malignant neoplasm of cervix: Secondary | ICD-10-CM

## 2016-04-18 DIAGNOSIS — E119 Type 2 diabetes mellitus without complications: Secondary | ICD-10-CM | POA: Diagnosis not present

## 2016-04-18 DIAGNOSIS — N951 Menopausal and female climacteric states: Secondary | ICD-10-CM | POA: Diagnosis not present

## 2016-04-18 DIAGNOSIS — R61 Generalized hyperhidrosis: Secondary | ICD-10-CM | POA: Diagnosis not present

## 2016-04-18 DIAGNOSIS — R232 Flushing: Secondary | ICD-10-CM

## 2016-04-18 DIAGNOSIS — Z113 Encounter for screening for infections with a predominantly sexual mode of transmission: Secondary | ICD-10-CM | POA: Diagnosis not present

## 2016-04-18 LAB — COMPREHENSIVE METABOLIC PANEL
ALT: 11 U/L (ref 6–29)
AST: 13 U/L (ref 10–35)
Albumin: 4 g/dL (ref 3.6–5.1)
Alkaline Phosphatase: 86 U/L (ref 33–115)
BUN: 24 mg/dL (ref 7–25)
CHLORIDE: 109 mmol/L (ref 98–110)
CO2: 25 mmol/L (ref 20–31)
CREATININE: 1.03 mg/dL (ref 0.50–1.10)
Calcium: 9.3 mg/dL (ref 8.6–10.2)
GLUCOSE: 82 mg/dL (ref 65–99)
POTASSIUM: 4.4 mmol/L (ref 3.5–5.3)
SODIUM: 142 mmol/L (ref 135–146)
Total Bilirubin: 0.4 mg/dL (ref 0.2–1.2)
Total Protein: 7.4 g/dL (ref 6.1–8.1)

## 2016-04-18 LAB — LIPID PANEL
CHOL/HDL RATIO: 3.7 ratio (ref <=5.0)
Cholesterol: 184 mg/dL (ref 125–200)
HDL: 50 mg/dL (ref >=46)
LDL CALC: 118 mg/dL (ref <130)
TRIGLYCERIDES: 82 mg/dL (ref <150)
VLDL: 16 mg/dL (ref <30)

## 2016-04-18 LAB — CBC
HCT: 36.5 % (ref 35.0–45.0)
Hemoglobin: 12 g/dL (ref 11.7–15.5)
MCH: 28.6 pg (ref 27.0–33.0)
MCHC: 32.9 g/dL (ref 32.0–36.0)
MCV: 87.1 fL (ref 80.0–100.0)
MPV: 10.1 fL (ref 7.5–12.5)
PLATELETS: 229 10*3/uL (ref 140–400)
RBC: 4.19 MIL/uL (ref 3.80–5.10)
RDW: 14.6 % (ref 11.0–15.0)
WBC: 5.5 10*3/uL (ref 3.8–10.8)

## 2016-04-18 LAB — HEMOGLOBIN A1C
Hgb A1c MFr Bld: 5.9 % — ABNORMAL HIGH (ref <5.7)
Mean Plasma Glucose: 123 mg/dL

## 2016-04-18 LAB — HM PAP SMEAR

## 2016-04-18 NOTE — Patient Instructions (Signed)

## 2016-04-18 NOTE — Addendum Note (Signed)
Addended by: Etta GrandchildJONES, Annali Lybrand L on: 04/18/2016 04:01 PM   Modules accepted: Kipp BroodSmartSet

## 2016-04-18 NOTE — Progress Notes (Signed)
Patient ID: Kylie Brennan, female   DOB: 02/15/1966, 50 y.o.   MRN: [id number] 50 y.o. [id number] SingleAfrican AmericanF here for annual exam.  Patient is c/o menopausal symptoms. She had 2 cycles last year. Last one was in 11/16. She c/o bad hot flashes and night sweats. She is taking an over the counter supplement, helps some. 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 She can tolerate her symptoms. She is sexually active, same partner off and on x 16 years. She doesn't have any other partners. He may intermittently have other partners. She desires STD testing. No dyspareunia.  Diabetes, diet controlled. Followed by her primary, but not for 2 years.     No LMP recorded. Patient is not currently having periods (Reason: Perimenopausal).          Sexually active: Yes.    The current method of family planning is cervical cap and post menopausal status.    Exercising: No.  The patient does not participate in regular exercise at present. Smoker:  yes  Health Maintenance: Pap:  unsure History of abnormal Pap:  Yes, no h/o cervical surgery. She had a biopsy in the early 90's. Last pap over 10+ years ago.  MMG:  08-03-14 WNL  Colonoscopy:  Never BMD:   Never TDaP:  10-08-08  Gardasil: N/A   reports that she has been smoking Cigarettes.  She has a 15 pack-year smoking history. She has never used smokeless tobacco. She reports that she drinks about 3.6 oz of alcohol per week. She reports that she uses illicit drugs (Marijuana). Kids are 8130 and 7621, 49 year old grandson  Past Medical History  Diagnosis Date  . Allergy   . GERD (gastroesophageal reflux disease)   . Depression   . COPD (chronic obstructive pulmonary disease) (HCC)   . Diabetes mellitus without complication (HCC)   . Blood transfusion without reported diagnosis   . Anxiety   . Anemia   . Osteoporosis   . Substance abuse   . STD (sexually transmitted disease)     gonorrhea/ trichamonus    Past Surgical History  Procedure Laterality Date  . Cesarean section      Current Outpatient Prescriptions  Medication Sig Dispense Refill  . Flaxseed, Linseed, (FLAX SEEDS PO) Take by mouth.    Marland Kitchen. ibuprofen (ADVIL,MOTRIN) 800 MG tablet Take 800 mg by mouth every 8 (eight) hours as needed.     No current facility-administered medications for this visit.    Family History  Problem Relation Age of Onset  . Breast cancer Mother   . Hypertension Mother   . Stroke Mother   . Alcohol abuse Father   . Throat cancer Father   . Colon cancer Neg Hx   . Kidney disease Neg Hx   . Hyperlipidemia Neg Hx   . Heart disease Neg Hx   . COPD Neg Hx   . Diabetes Neg Hx   . Drug abuse Neg Hx   . Early death Neg Hx   . Hearing loss Neg Hx     Review of Systems  Constitutional: Negative.   HENT: Negative.   Eyes: Negative.   Respiratory: Negative.   Cardiovascular: Negative.   Gastrointestinal: Negative.   Endocrine: Negative.   Genitourinary: Negative.        Hot flashes  Musculoskeletal: Negative.   Skin: Positive for rash.       Dry skin on  right inner thigh   Allergic/Immunologic: Negative.   Neurological: Negative.   Psychiatric/Behavioral: Positive for sleep disturbance.    Exam:   BP 138/80 mmHg  Pulse 80  Resp 14  Ht 4' 11.5" (1.511 m)  Wt 199 lb (90.266 kg)  BMI 39.54 kg/m2  Weight change: @ Height:   Height: 4' 11.5" (151.1 cm)  Ht Readings from Last 3 Encounters:  04/18/16 4' 11.5" (1.511 m)  07/08/13  (1.499 m)  04/24/10  (1.549 m)    General appearance: alert, cooperative and appears stated age Head: Normocephalic, without obvious abnormality, atraumatic Neck: no adenopathy, supple, symmetrical, trachea midline and thyroid normal to inspection and palpation Lungs: clear to auscultation bilaterally Breasts: normal appearance, no masses or tenderness Heart: regular rate and rhythm Abdomen: soft, non-tender;  bowel sounds normal; no masses,  no organomegaly Extremities: extremities normal, atraumatic, no cyanosis or edema Skin: Skin color, texture, turgor normal. No rashes or lesions Lymph nodes: Cervical, supraclavicular, and axillary nodes normal. No abnormal inguinal nodes palpated Neurologic: Grossly normal   Pelvic: External genitalia:  no lesions              Urethra:  normal appearing urethra with no masses, tenderness or lesions              Bartholins and Skenes: normal                 Vagina: normal appearing vagina with normal color and discharge, no lesions              Cervix: no lesions               Bimanual Exam:  Uterus:  normal size, contour, position, consistency, mobility, non-tender and anteverted              Adnexa: no mass, fullness, tenderness               Rectovaginal: Confirms               Anus:  normal sphincter tone, no lesions  Chaperone was present for exam.  A:  Well Woman with normal exam  Perimenopausal, vasomotor symptoms    P:   Discussed behavioral changes for vasomotor symptoms  Try estroven  Pap with hpv  Screening STD  Baseline labs  Discussed breast self exam  Discussed calcium and vit D intake  Mammogram

## 2016-04-19 LAB — STD PANEL
HIV 1&2 Ab, 4th Generation: NONREACTIVE
Hepatitis B Surface Ag: NEGATIVE

## 2016-04-19 LAB — VITAMIN D 25 HYDROXY (VIT D DEFICIENCY, FRACTURES): Vit D, 25-Hydroxy: 14 ng/mL — ABNORMAL LOW (ref 30–100)

## 2016-04-19 LAB — HEPATITIS C ANTIBODY: HCV Ab: NEGATIVE

## 2016-04-20 LAB — IPS N GONORRHOEA AND CHLAMYDIA BY PCR

## 2016-04-20 LAB — IPS PAP TEST WITH HPV

## 2016-04-30 ENCOUNTER — Telehealth: Payer: Self-pay | Admitting: *Deleted

## 2016-04-30 MED ORDER — VITAMIN D (ERGOCALCIFEROL) 1.25 MG (50000 UNIT) PO CAPS: 50000.0000 [IU] | ORAL_CAPSULE | ORAL | Status: DC

## 2016-04-30 MED ORDER — METRONIDAZOLE 500 MG PO TABS: 500.0000 mg | ORAL_TABLET | Freq: Two times a day (BID) | ORAL | Status: DC

## 2016-04-30 NOTE — Telephone Encounter (Signed)
Patient is aware of her results. She is having some discharge so I went ahead and called in the Flagyl. I also sent in her Vitamin D. She is set up for a 3 month recheck of vit d -eh

## 2016-04-30 NOTE — Telephone Encounter (Signed)
-----   Message from Romualdo BolkJill Evelyn Jertson, MD sent at 04/29/2016  2:51 PM EDT ----- 02 Please inform the patient that her pap was + for BV and if symptomatic, treat  with flagyl (either oral or vaginal, her choice), no ETOH while on Flagyl. If she isn't symptomatic, no treatment is needed.  Oral: Flagyl 500 mg BID x 7 days, or Vaginal: Metrogel, 1 applicator per vagina q day x 5 days. Her diabetes is well controlled, her HgbA1C is in the pre-diabetic range Her Vit d is very low, please call in 50,000 IU q 7 days x 12 weeks. #12, no refills. She will need a f/u vit d level in 3 months The rest of her lab work was normal, including negative STD testing

## 2016-07-31 ENCOUNTER — Other Ambulatory Visit (INDEPENDENT_AMBULATORY_CARE_PROVIDER_SITE_OTHER): Payer: Managed Care, Other (non HMO)

## 2016-07-31 DIAGNOSIS — E559 Vitamin D deficiency, unspecified: Secondary | ICD-10-CM

## 2016-07-31 DIAGNOSIS — Z0000 Encounter for general adult medical examination without abnormal findings: Secondary | ICD-10-CM

## 2016-08-01 LAB — VITAMIN D 25 HYDROXY (VIT D DEFICIENCY, FRACTURES): VIT D 25 HYDROXY: 37 ng/mL (ref 30–100)

## 2017-01-21 ENCOUNTER — Telehealth: Payer: Self-pay | Admitting: Obstetrics and Gynecology

## 2017-01-21 NOTE — Telephone Encounter (Signed)
Left message regarding upcoming appointment has been canceled and needs to be rescheduled. °

## 2017-04-18 ENCOUNTER — Ambulatory Visit (INDEPENDENT_AMBULATORY_CARE_PROVIDER_SITE_OTHER): Payer: Managed Care, Other (non HMO) | Admitting: Obstetrics and Gynecology

## 2017-04-18 ENCOUNTER — Encounter: Payer: Self-pay | Admitting: Obstetrics and Gynecology

## 2017-04-18 VITALS — BP 120/70 | HR 84 | Resp 16 | Ht 60.0 in | Wt 197.0 lb

## 2017-04-18 DIAGNOSIS — Z0000 Encounter for general adult medical examination without abnormal findings: Secondary | ICD-10-CM | POA: Diagnosis not present

## 2017-04-18 DIAGNOSIS — Z6838 Body mass index (BMI) 38.0-38.9, adult: Secondary | ICD-10-CM | POA: Diagnosis not present

## 2017-04-18 DIAGNOSIS — N761 Subacute and chronic vaginitis: Secondary | ICD-10-CM | POA: Diagnosis not present

## 2017-04-18 DIAGNOSIS — Z113 Encounter for screening for infections with a predominantly sexual mode of transmission: Secondary | ICD-10-CM | POA: Diagnosis not present

## 2017-04-18 DIAGNOSIS — Z01419 Encounter for gynecological examination (general) (routine) without abnormal findings: Secondary | ICD-10-CM

## 2017-04-18 DIAGNOSIS — Z8639 Personal history of other endocrine, nutritional and metabolic disease: Secondary | ICD-10-CM | POA: Diagnosis not present

## 2017-04-18 LAB — LIPID PANEL
Cholesterol: 174 mg/dL (ref <200)
HDL: 53 mg/dL (ref >50)
LDL CALC: 111 mg/dL — AB (ref <100)
TRIGLYCERIDES: 49 mg/dL (ref <150)
Total CHOL/HDL Ratio: 3.3 Ratio (ref <5.0)
VLDL: 10 mg/dL (ref <30)

## 2017-04-18 LAB — COMPREHENSIVE METABOLIC PANEL
ALT: 8 U/L (ref 6–29)
AST: 12 U/L (ref 10–35)
Albumin: 4 g/dL (ref 3.6–5.1)
Alkaline Phosphatase: 88 U/L (ref 33–115)
BUN: 18 mg/dL (ref 7–25)
CALCIUM: 9.3 mg/dL (ref 8.6–10.2)
CHLORIDE: 109 mmol/L (ref 98–110)
CO2: 21 mmol/L (ref 20–31)
Creat: 0.83 mg/dL (ref 0.50–1.10)
GLUCOSE: 94 mg/dL (ref 65–99)
POTASSIUM: 4.5 mmol/L (ref 3.5–5.3)
Sodium: 141 mmol/L (ref 135–146)
Total Bilirubin: 0.6 mg/dL (ref 0.2–1.2)
Total Protein: 6.6 g/dL (ref 6.1–8.1)

## 2017-04-18 LAB — CBC
HEMATOCRIT: 34.3 % — AB (ref 35.0–45.0)
Hemoglobin: 11.3 g/dL — ABNORMAL LOW (ref 11.7–15.5)
MCH: 28.5 pg (ref 27.0–33.0)
MCHC: 32.9 g/dL (ref 32.0–36.0)
MCV: 86.6 fL (ref 80.0–100.0)
MPV: 9.9 fL (ref 7.5–12.5)
PLATELETS: 235 10*3/uL (ref 140–400)
RBC: 3.96 MIL/uL (ref 3.80–5.10)
RDW: 14.9 % (ref 11.0–15.0)
WBC: 6.6 10*3/uL (ref 3.8–10.8)

## 2017-04-18 LAB — HEPATITIS C ANTIBODY: HCV AB: NEGATIVE

## 2017-04-18 NOTE — Progress Notes (Signed)
51 y.o. [id number] SingleAfrican AmericanF here for annual exam.  She is menopausal, over the last month her vasomotor symptoms have worsened. Tolerable. She is taking some herbal products.  Not currently sexually active, stopped seeing her partner 8 months. Happy being single. Desires STD  She just finished an antibiotic and has some mild vaginal itching. She had ?flagyl left over and took one on Monday, thinks she is getting better.     No LMP recorded. Patient is postmenopausal.          Sexually active: not currently The current method of family planning is post menopausal status.    Exercising: Yes.    squats/walking/ weights  Smoker:  yes  Health Maintenance: Pap: 04-18-16 WNL NEG HR HPV  History of abnormal Pap:  Yes 1993- colposcopy  MMG:  08-03-14 WNL  Colonoscopy:  Never BMD:   Never TDaP:  10-08-08  Gardasil: N/A   reports that she has been smoking Cigarettes.  She has a 15.00 pack-year smoking history. She has never used smokeless tobacco. She reports that she uses drugs, including Marijuana. She reports that she does not drink alcohol. Kids are 10 and 21, 10 year old grandson. She works for a [employer].  Past Medical History:  Diagnosis Date  . Allergy   . Anemia   . Anxiety   . Blood transfusion without reported diagnosis   . COPD (chronic obstructive pulmonary disease) (HCC)   . Depression   . Diabetes mellitus without complication (HCC)   . GERD (gastroesophageal reflux disease)   . Osteoporosis   . STD (sexually transmitted disease)    gonorrhea/ trichamonus  . Substance abuse     Past Surgical History:  Procedure Laterality Date  . CESAREAN SECTION    . COLPOSCOPY      Current Outpatient Prescriptions  Medication Sig Dispense Refill  . cetirizine (ZYRTEC) 10 MG tablet Take 10 mg by mouth.    . Flaxseed, Linseed, (FLAX SEEDS PO) Take by mouth.    Marland Kitchen ibuprofen (ADVIL,MOTRIN) 800 MG tablet Take 800 mg by mouth every 8 (eight) hours as needed.     No  current facility-administered medications for this visit.     Family History  Problem Relation Age of Onset  . Breast cancer Mother   . Hypertension Mother   . Stroke Mother   . Alcohol abuse Father   . Throat cancer Father   . Colon cancer Neg Hx   . Kidney disease Neg Hx   . Hyperlipidemia Neg Hx   . Heart disease Neg Hx   . COPD Neg Hx   . Diabetes Neg Hx   . Drug abuse Neg Hx   . Early death Neg Hx   . Hearing loss Neg Hx     Review of Systems  Constitutional: Negative.   HENT: Negative.   Eyes: Negative.   Respiratory: Negative.   Cardiovascular: Negative.   Gastrointestinal: Negative.   Endocrine: Negative.   Genitourinary: Positive for vaginal discharge.       Vaginal itching   Musculoskeletal: Negative.   Skin: Negative.   Allergic/Immunologic: Negative.   Neurological: Negative.   Psychiatric/Behavioral: Negative.     Exam:   BP 120/70 (BP Location: Right Arm, Patient Position: Sitting, Cuff Size: Normal)   Pulse 84   Resp 16   Ht 5' (1.524 m)   Wt 197 lb (89.4 kg)   BMI 38.47 kg/m   Weight change: @ Height:   Height: 5' (152.4 cm)  Ht  Readings from Last 3 Encounters:  04/18/17 5' (1.524 m)  04/18/16 4' 11.5" (1.511 m)  07/08/13  (1.499 m)    General appearance: alert, cooperative and appears stated age Head: Normocephalic, without obvious abnormality, atraumatic Neck: no adenopathy, supple, symmetrical, trachea midline and thyroid normal to inspection and palpation Lungs: clear to auscultation bilaterally Cardiovascular: regular rate and rhythm Breasts: normal appearance, no masses or tenderness Abdomen: soft, non-tender; bowel sounds normal; no masses,  no organomegaly Extremities: extremities normal, atraumatic, no cyanosis or edema Skin: Skin color, texture, turgor normal. No rashes or lesions Lymph nodes: Cervical, supraclavicular, and axillary nodes normal. No abnormal inguinal nodes palpated Neurologic: Grossly  normal   Pelvic: External genitalia:  no lesions              Urethra:  normal appearing urethra with no masses, tenderness or lesions              Bartholins and Skenes: normal                 Vagina: normal appearing vagina with a slight increase in watery, white vaginal d/c              Cervix: no lesions               Bimanual Exam:  Uterus:  normal size, contour, position, consistency, mobility, non-tender              Adnexa: no mass, fullness, tenderness               Rectovaginal: Confirms               Anus:  normal sphincter tone, no lesions  Chaperone was present for exam.  A:  Well Woman with normal exam  H/O Diabetes, diet controlled  H/O vit D def  Vasomotor symptoms  Vaginitis symptoms  P:   No pap this year  Discussed breast self exam  Discussed calcium and vit D intake  Screening labs  STD testing  Vit D and HgbA1C  Vaginitis probe  Mammogram due, # given  Colonoscopy recommended after she turns 51

## 2017-04-18 NOTE — Patient Instructions (Signed)

## 2017-04-19 LAB — VITAMIN D 25 HYDROXY (VIT D DEFICIENCY, FRACTURES): Vit D, 25-Hydroxy: 43 ng/mL (ref 30–100)

## 2017-04-19 LAB — STD PANEL
HEP B S AG: NEGATIVE
HIV 1&2 Ab, 4th Generation: NONREACTIVE

## 2017-04-19 LAB — WET PREP BY MOLECULAR PROBE
CANDIDA SPECIES: NOT DETECTED
Gardnerella vaginalis: DETECTED — AB
TRICHOMONAS VAG: NOT DETECTED

## 2017-04-19 LAB — HEMOGLOBIN A1C
Hgb A1c MFr Bld: 5.7 % — ABNORMAL HIGH (ref <5.7)
Mean Plasma Glucose: 117 mg/dL

## 2017-04-22 ENCOUNTER — Ambulatory Visit: Payer: Managed Care, Other (non HMO)

## 2017-04-22 ENCOUNTER — Ambulatory Visit: Payer: 59 | Admitting: Obstetrics and Gynecology

## 2017-04-22 ENCOUNTER — Telehealth: Payer: Self-pay | Admitting: Obstetrics and Gynecology

## 2017-04-22 NOTE — Telephone Encounter (Signed)
Patient [facility] appointment for today for urine sample. Call to patient [facility] about missed appt/rd

## 2017-04-25 ENCOUNTER — Ambulatory Visit: Payer: Managed Care, Other (non HMO)

## 2017-04-25 DIAGNOSIS — Z113 Encounter for screening for infections with a predominantly sexual mode of transmission: Secondary | ICD-10-CM

## 2017-04-25 NOTE — Addendum Note (Signed)
Addended by: Shelda JakesHANNER, Navid Lenzen E on: 04/25/2017 10:37 AM   Modules accepted: Orders

## 2017-04-26 ENCOUNTER — Other Ambulatory Visit: Payer: Self-pay | Admitting: Obstetrics and Gynecology

## 2017-04-26 LAB — GC/CHLAMYDIA PROBE AMP
CT PROBE, AMP APTIMA: NOT DETECTED
GC PROBE AMP APTIMA: NOT DETECTED

## 2017-04-26 MED ORDER — METRONIDAZOLE 500 MG PO TABS: 500.0000 mg | ORAL_TABLET | Freq: Two times a day (BID) | ORAL | 0 refills | Status: DC

## 2017-09-01 LAB — HM DIABETES EYE EXAM

## 2018-01-07 ENCOUNTER — Encounter (HOSPITAL_COMMUNITY): Payer: Self-pay

## 2018-01-07 ENCOUNTER — Emergency Department (HOSPITAL_COMMUNITY): Payer: Managed Care, Other (non HMO)

## 2018-01-07 ENCOUNTER — Other Ambulatory Visit: Payer: Self-pay

## 2018-01-07 ENCOUNTER — Emergency Department (HOSPITAL_COMMUNITY)
Admission: EM | Admit: 2018-01-07 | Discharge: 2018-01-07 | Disposition: A | Discharge status: Home / Self Care | Payer: Managed Care, Other (non HMO) | Attending: Emergency Medicine | Admitting: Emergency Medicine

## 2018-01-07 DIAGNOSIS — Y929 Unspecified place or not applicable: Secondary | ICD-10-CM | POA: Diagnosis not present

## 2018-01-07 DIAGNOSIS — Z79899 Other long term (current) drug therapy: Secondary | ICD-10-CM | POA: Insufficient documentation

## 2018-01-07 DIAGNOSIS — Y939 Activity, unspecified: Secondary | ICD-10-CM | POA: Diagnosis not present

## 2018-01-07 DIAGNOSIS — S92251A Displaced fracture of navicular [scaphoid] of right foot, initial encounter for closed fracture: Secondary | ICD-10-CM | POA: Diagnosis not present

## 2018-01-07 DIAGNOSIS — Y999 Unspecified external cause status: Secondary | ICD-10-CM | POA: Diagnosis not present

## 2018-01-07 DIAGNOSIS — S99821A Other specified injuries of right foot, initial encounter: Secondary | ICD-10-CM | POA: Diagnosis present

## 2018-01-07 DIAGNOSIS — W1842XA Slipping, tripping and stumbling without falling due to stepping into hole or opening, initial encounter: Secondary | ICD-10-CM | POA: Insufficient documentation

## 2018-01-07 DIAGNOSIS — F17210 Nicotine dependence, cigarettes, uncomplicated: Secondary | ICD-10-CM | POA: Insufficient documentation

## 2018-01-07 DIAGNOSIS — J449 Chronic obstructive pulmonary disease, unspecified: Secondary | ICD-10-CM | POA: Insufficient documentation

## 2018-01-07 MED ORDER — IBUPROFEN 200 MG PO TABS
400.0000 mg | ORAL_TABLET | Freq: Once | ORAL | Status: AC | PRN
  Administered 2018-01-07: 400 mg via ORAL
  Filled 2018-01-07: qty 2

## 2018-01-07 MED ORDER — OXYCODONE-ACETAMINOPHEN 5-325 MG PO TABS: 1.0000 | ORAL_TABLET | Freq: Once | ORAL | Status: DC

## 2018-01-07 NOTE — ED Notes (Signed)
Bed: WTR6 Expected date:  Expected time:  Means of arrival:  Comments: 

## 2018-01-07 NOTE — Discharge Instructions (Signed)
X-rays show a small avulsion fracture of your navicular bone. This is treated with a short leg splint for at least 7-10 days. Follow-up with podiatrist in 7-10 days for reevaluation of swelling, pain and healing. He may do a repeat x-ray to determine if there is appropriate healing of fracture. Take ibuprofen and Tylenol for pain. Ice. Use crutches to mobilize.

## 2018-01-07 NOTE — ED Provider Notes (Signed)
[facility]-EMERGENCY DEPT Provider Note   CSN: [id number] Arrival date & time: 01/07/18  1036     History   Chief Complaint Chief Complaint  Patient presents with  . Fall  . Foot Pain  . Knee Pain    HPI Kylie Brennan is a 52 y.o. female with no significant past medical history presents for evaluation of sudden onset, gradually worsening, intermittent in, sharp pain to the right lateral foot since fall yesterday. Patient states she stepped into a hole last night and fell rolling her ankle inward. He was ambulatory after fall and since with some pain. Aggravating factors include weightbearing and direct palpation to the lateral aspect of the right foot. Alleviating factors include ibuprofen, rest, elevation, ice which she tried last night. Swelling became worse this morning and she became concerned of possible fracture. Denies prodromal symptoms including dizziness, chest pain, shortness of breath before fall and states it was mechanical. No head trauma, LOC, anticoagulants. No previous injury or surgery to this joint.  No fevers, chills, redness to the ankle or warmth, numbness or tingling distally. No history of gout, diabetes or IV drug use. HPI  Past Medical History:  Diagnosis Date  . Allergy   . Anemia   . Anxiety   . Blood transfusion without reported diagnosis   . COPD (chronic obstructive pulmonary disease) (HCC)   . Depression   . Diabetes mellitus without complication (HCC)   . GERD (gastroesophageal reflux disease)   . Osteoporosis   . STD (sexually transmitted disease)    gonorrhea/ trichamonus  . Substance abuse [facility])     Patient Active Problem List   Diagnosis Date Noted  . Routine general medical examination at a health care facility 07/08/2013  . Other screening mammogram 07/08/2013  . ECZEMA 04/24/2010  . Severe obesity (BMI >= 40) (HCC) 03/30/2010  . TOBACCO ABUSE 09/08/2008    Past Surgical History:  Procedure Laterality Date    . CESAREAN SECTION    . COLPOSCOPY      OB History    Gravida Para Term Preterm AB Living   3 2 2   1 2    SAB TAB Ectopic Multiple Live Births           2       Home Medications    Prior to Admission medications   Medication Sig Start Date End Date Taking? Authorizing Provider  cetirizine (ZYRTEC) 10 MG tablet Take 10 mg by mouth.    [provider]  Flaxseed, Linseed, (FLAX SEEDS PO) Take by mouth.    [provider]  ibuprofen (ADVIL,MOTRIN) 800 MG tablet Take 800 mg by mouth every 8 (eight) hours as needed.    [provider]  metroNIDAZOLE (FLAGYL) 500 MG tablet Take 1 tablet (500 mg total) by mouth 2 (two) times daily. 04/26/17   Romualdo Bolk, MD    Family History Family History  Problem Relation Age of Onset  . Breast cancer Mother   . Hypertension Mother   . Stroke Mother   . Alcohol abuse Father   . Throat cancer Father   . Colon cancer Neg Hx   . Kidney disease Neg Hx   . Hyperlipidemia Neg Hx   . Heart disease Neg Hx   . COPD Neg Hx   . Diabetes Neg Hx   . Drug abuse Neg Hx   . Early death Neg Hx   . Hearing loss Neg Hx     Social  History Social History   Tobacco Use  . Smoking status: Current Every Day Smoker    Packs/day: 0.50    Years: 30.00    Pack years: 15.00    Types: Cigarettes  . Smokeless tobacco: Never Used  Substance Use Topics  . Alcohol use: No    Alcohol/week: 0.0 oz  . Drug use: Yes    Types: Marijuana     Allergies   Patient has no known allergies.   Review of Systems Review of Systems  Musculoskeletal: Positive for arthralgias, gait problem and joint swelling.  All other systems reviewed and are negative.    Physical Exam Updated Vital Signs BP 115/76   Pulse 78   Temp (!) 97.5 F (36.4 C)   Resp 18   Ht 4' 11.5" (1.511 m)   Wt 86.2 kg (190 lb)   SpO2 98%   BMI 37.73 kg/m   Physical Exam  Constitutional: She is oriented to person, place, and time. She appears  well-developed and well-nourished. No distress.  NAD.  HENT:  Head: Normocephalic and atraumatic.  Right Ear: External ear normal.  Left Ear: External ear normal.  Nose: Nose normal.  Eyes: Conjunctivae and EOM are normal. No scleral icterus.  Neck: Normal range of motion. Neck supple.  Cardiovascular: Normal rate, regular rhythm and normal heart sounds.  No murmur heard. 2+ DP and PT pulses bilaterally. No asymmetric calf edema or tenderness.  Pulmonary/Chest: Effort normal and breath sounds normal. She has no wheezes.  Musculoskeletal: Normal range of motion. She exhibits edema and tenderness. She exhibits no deformity.  Focal tenderness to area below right lateral malleolus and surrounding navicular bone with local edema. Full passive flexion and extension of the right ankle, decreased inversion and eversion due to pain. Able to wiggle her toes. No focal tenderness to knee, tibia, fibula, calcaneus, medial malleolus or other bony prominences of the right lower extremity.  Neurological: She is alert and oriented to person, place, and time.  Sensation to light touch intact in right lower extremity and foot. 5/5 strength with right ankle flexion and extension.  Skin: Skin is warm and dry. Capillary refill takes less than 2 seconds.  No erythema, warmth, ecchymosis or injury of the skin to the right foot.  Psychiatric: She has a normal mood and affect. Her behavior is normal. Judgment and thought content normal.  Nursing note and vitals reviewed.    ED Treatments / Results  Labs (all labs ordered are listed, but only abnormal results are displayed) Labs Reviewed - No data to display  EKG  EKG Interpretation None       Radiology Dg Foot Complete Right  Result Date: 01/07/2018 CLINICAL DATA:  Fall.  Right foot pain.  Lateral foot pain EXAM: RIGHT FOOT COMPLETE - 3+ VIEW COMPARISON:  None. FINDINGS: Small avulsion fracture of the dorsal navicular only seen on the lateral view.  Correlate with physical exam. Otherwise no fracture. IMPRESSION: Small avulsion fracture dorsal surface of the navicular bone. Correlate with exam. Electronically Signed   By: Marlan Palauharles  Clark M.D.   On: 01/07/2018 11:35    Procedures Procedures (including critical care time)  Medications Ordered in ED Medications  oxyCODONE-acetaminophen (PERCOCET/ROXICET) 5-325 MG per tablet 1 tablet (1 tablet Oral Refused 01/07/18 1341)  ibuprofen (ADVIL,MOTRIN) tablet 400 mg (400 mg Oral Given 01/07/18 1251)     Initial Impression / Assessment and Plan / ED Course  I have reviewed the triage vital signs and the nursing notes.  Pertinent  labs & imaging results that were available during my care of the patient were reviewed by me and considered in my medical decision making (see chart for details).    52 year old female presents with acute pain to the right lateral ankle after inversion injury yesterday.  On exam she has focal tenderness over navicular bone and area below lateral malleolus with mild edema. Extremity otherwise are vascularly intact.  X-ray shows small, avulsion fracture of the navicular bone. Will place an short leg splint and follow-up with podiatry in 7-10 days for reevaluation. Discussed treatment plan with the patient was agreeable with this. Splint placed by [employer], good pulses post-splinting.  Final Clinical Impressions(s) / ED Diagnoses   Final diagnoses:  Closed avulsion fracture of navicular bone of right foot, initial encounter    ED Discharge Orders    None       Jerrell Mylar 01/07/18 1405    Raeford Razor, MD 01/07/18 [phone]

## 2018-01-07 NOTE — ED Triage Notes (Signed)
Patient states she stepped into a hole last night and fell. Patient c/o left knee pain and right foot pain. Patient has swelling to the right foot.

## 2018-01-07 NOTE — ED Notes (Signed)
Bed: WTR9 Expected date:  Expected time:  Means of arrival:  Comments: 

## 2018-01-08 NOTE — ED Notes (Signed)
Open chart to extend work note to her appointment on the 23rd

## 2018-01-15 ENCOUNTER — Other Ambulatory Visit: Payer: Self-pay

## 2018-01-15 ENCOUNTER — Encounter: Payer: Self-pay | Admitting: Podiatry

## 2018-01-15 ENCOUNTER — Other Ambulatory Visit: Payer: Self-pay | Admitting: Podiatry

## 2018-01-15 ENCOUNTER — Ambulatory Visit: Payer: Managed Care, Other (non HMO) | Admitting: Podiatry

## 2018-01-15 ENCOUNTER — Ambulatory Visit (INDEPENDENT_AMBULATORY_CARE_PROVIDER_SITE_OTHER): Payer: Managed Care, Other (non HMO)

## 2018-01-15 VITALS — BP 121/80 | HR 97 | Ht 59.0 in | Wt 192.0 lb

## 2018-01-15 DIAGNOSIS — M79671 Pain in right foot: Secondary | ICD-10-CM

## 2018-01-15 DIAGNOSIS — S92251A Displaced fracture of navicular [scaphoid] of right foot, initial encounter for closed fracture: Secondary | ICD-10-CM

## 2018-01-15 DIAGNOSIS — S92254A Nondisplaced fracture of navicular [scaphoid] of right foot, initial encounter for closed fracture: Secondary | ICD-10-CM | POA: Diagnosis not present

## 2018-01-15 MED ORDER — MELOXICAM 15 MG PO TABS: 15.0000 mg | ORAL_TABLET | Freq: Every day | ORAL | 1 refills | Status: AC

## 2018-01-15 MED ORDER — MELOXICAM 15 MG PO TABS: 15.0000 mg | ORAL_TABLET | Freq: Every day | ORAL | 1 refills | Status: DC

## 2018-01-18 NOTE — Progress Notes (Signed)
   HPI: 52 year old female presenting today as a new patient with a chief complaint of pain to the lateral right foot that began secondary to an injury on 01/06/18. She states she tripped over her feet. She was seen in the ED the next day and had X-Rays revealing a small avulsion fracture of the navicular bone. She was put in a soft cast at that time. She removed the splint on 01/10/18 because the swelling had improved and it was not fitting correctly at that time. She reports she then stepped in a hole causing her to fall 8 days ago causing sudden onset sharp pain to the lateral edge of the foot. There are no modifying factors noted. Patient is here for further evaluation and treatment.   Past Medical History:  Diagnosis Date  . Allergy   . Anemia   . Anxiety   . Blood transfusion without reported diagnosis   . COPD (chronic obstructive pulmonary disease) (HCC)   . Depression   . Diabetes mellitus without complication (HCC)   . GERD (gastroesophageal reflux disease)   . Osteoporosis   . STD (sexually transmitted disease)    gonorrhea/ trichamonus  . Substance abuse [facility](HCC)      Physical Exam: General: The patient is alert and oriented x3 in no acute distress.  Dermatology: Skin is warm, dry and supple bilateral lower extremities. Negative for open lesions or macerations.  Vascular: Palpable pedal pulses bilaterally. No edema or erythema noted. Capillary refill within normal limits.  Neurological: Epicritic and protective threshold grossly intact bilaterally.   Musculoskeletal Exam: Range of motion within normal limits to all pedal and ankle joints bilateral. Muscle strength 5/5 in all groups bilateral.   Assessment: - Small avulsion fragment navicular right    Plan of Care:  - Patient evaluated. X-Rays from 01/07/18 reviewed. - Resume good shoe gear. Fracture is very subtle and currently nonsymptomatic.  - Full activity with no restrictions to tolerance.  - Note for work  provided. - Prescription for Mobic as needed provided to patient.  - Return to clinic as needed.   Felecia ShellingBrent M. Izaah Westman, DPM [facility]  Dr. Felecia ShellingBrent M. Nick Armel, DPM    [address]                                        [place], [place] [address]                Office [phone]  Fax [phone]

## 2018-04-24 ENCOUNTER — Ambulatory Visit: Payer: Managed Care, Other (non HMO) | Admitting: Obstetrics and Gynecology

## 2018-05-26 NOTE — Progress Notes (Signed)
52 y.o. [id number] SingleAfrican AmericanF here for annual exam.  Postmenopausal, LMP ~11/16. No bleeding. Occasionally she feels like she is going to get a cycle, with bilateral breast tenderness and headaches. Doesn't last long. Not sexually active in the last few years.   She c/o a couple day h/o stomach upset. She wakes up with a hot flash, gets dizzy and then nausea. Decreased appetite this week. No emesis, no abdominal pain. No diarrhea or constipation.   She broke her right foot in January. She takes care of her Mom, lives with her. Mom is able to to activities of daily living, has some memory issues.   She has a h/o gestational diabetes and then pre-diabetes.     No LMP recorded. Patient is postmenopausal.          Sexually active: No.  The current method of family planning is post menopausal status.    Exercising: No.  The patient does not participate in regular exercise at present. Smoker:  Yes, 5-8 cigarettes a day.  Health Maintenance: Pap: 04-18-16 Neg:Neg HR HPV History of abnormal Pap:  Yes, Hx of colpo 1993 MMG: 08-02-14 Density B/Neg/BiRads1, [facility] Colonoscopy:  n/a BMD:   n/a TDaP:  10-08-08 Gardasil: n/a   reports that she has been smoking cigarettes.  She has a 15.00 pack-year smoking history. She has never used smokeless tobacco. She reports that she has current or past drug history. Drug: Marijuana. She reports that she does not drink alcohol. Kids are 4932 and 2823, 51 year old grandson. She works for a [employer] company.    Past Medical History:  Diagnosis Date  . Allergy   . Anemia   . Anxiety   . Blood transfusion without reported diagnosis   . COPD (chronic obstructive pulmonary disease) (HCC)   . Depression   . Diabetes mellitus without complication (HCC)   . GERD (gastroesophageal reflux disease)   . Osteoporosis   . STD (sexually transmitted disease)    gonorrhea/ trichamonus  . Substance abuse (HCC)    Clean x 15 years  Past Surgical History:  Procedure Laterality Date  . CESAREAN SECTION    . COLPOSCOPY    C/S x 2  Current Outpatient Medications  Medication Sig Dispense Refill  . cetirizine (ZYRTEC) 10 MG tablet Take 10 mg by mouth.    . cyclobenzaprine (FLEXERIL) 5 MG tablet TK 1 T PO TID UTD FOR MUSCLE SPASMS FOR 5 DAYS  1  . Flaxseed, Linseed, (FLAX SEEDS PO) Take by mouth.    Marland Kitchen. ibuprofen (ADVIL,MOTRIN) 800 MG tablet Take 800 mg by mouth every 8 (eight) hours as needed.    . meloxicam (MOBIC) 15 MG tablet TK 1 T PO D  1  . methocarbamol (ROBAXIN) 500 MG tablet TK 2 TS PO QHS PRF MUSCLE SPASM  0   No current facility-administered medications for this visit.     Family History  Problem Relation Age of Onset  . Breast cancer Mother   . Hypertension Mother   . Stroke Mother   . Alcohol abuse Father   . Throat cancer Father   . Colon cancer Neg Hx   . Kidney disease Neg Hx   . Hyperlipidemia Neg Hx   . Heart disease Neg Hx   . COPD Neg Hx   . Diabetes Neg Hx   . Drug abuse Neg Hx   . Early death Neg Hx   . Hearing loss Neg Hx  Review of Systems  Constitutional: Positive for chills.  HENT: Negative.   Eyes: Negative.   Respiratory: Negative.   Cardiovascular: Negative.   Gastrointestinal: Positive for nausea.  Endocrine: Positive for cold intolerance and heat intolerance.  Musculoskeletal: Positive for arthralgias and myalgias.  Skin:       itching  Allergic/Immunologic: Negative.   Neurological: Negative.   Hematological: Negative.   Psychiatric/Behavioral: Negative.     Exam:   BP 128/68 (BP Location: Right Arm, Patient Position: Sitting, Cuff Size: Normal)   Pulse 74   Resp 14   Ht 5' (1.524 m)   Wt 198 lb 1.6 oz (89.9 kg)   BMI 38.69 kg/m   Weight change: @WEIGHTCHANGE @ Height:   Height: 5' (152.4 cm)  Ht Readings from Last 3 Encounters:  05/29/18 5' (1.524 m)  01/15/18 4\' 11"  (1.499 m)  01/07/18 4' 11.5" (1.511 m)    General appearance:  alert, cooperative and appears stated age Head: Normocephalic, without obvious abnormality, atraumatic Neck: no adenopathy, supple, symmetrical, trachea midline and thyroid normal to inspection and palpation Lungs: clear to auscultation bilaterally Cardiovascular: regular rate and rhythm Breasts: normal appearance, no masses or tenderness Abdomen: soft, non-tender; non distended,  no masses,  no organomegaly Extremities: extremities normal, atraumatic, no cyanosis or edema Skin: Skin color, texture, turgor normal. No rashes or lesions Lymph nodes: Cervical, supraclavicular, and axillary nodes normal. No abnormal inguinal nodes palpated Neurologic: Grossly normal   Pelvic: External genitalia:  no lesions              Urethra:  normal appearing urethra with no masses, tenderness or lesions              Bartholins and Skenes: normal                 Vagina: normal appearing atrophic vagina with normal color and discharge, no lesions              Cervix: no lesions               Bimanual Exam:  Uterus:  normal size, contour, position, consistency, mobility, non-tender              Adnexa: no mass, fullness, tenderness               Rectovaginal: Confirms               Anus:  normal sphincter tone, no lesions  Chaperone was present for exam.  A:  Well Woman with normal exam  Prediabetes  Vit d def  Smoker  P:   Needs referral to primary MD  Screening labs, HgbA1C, TSH, Vit D (fasting)  Encouraged to quit smoking  Discussed breast self exam  Discussed calcium and vit D intake  Mammogram scheduled  TDAP  Discussed weight loss  Pap next year  Cologuard

## 2018-05-29 ENCOUNTER — Encounter: Payer: Self-pay | Admitting: Obstetrics and Gynecology

## 2018-05-29 ENCOUNTER — Other Ambulatory Visit: Payer: Self-pay

## 2018-05-29 ENCOUNTER — Other Ambulatory Visit: Payer: Self-pay | Admitting: Obstetrics and Gynecology

## 2018-05-29 ENCOUNTER — Ambulatory Visit: Payer: Managed Care, Other (non HMO) | Admitting: Obstetrics and Gynecology

## 2018-05-29 VITALS — BP 128/68 | HR 74 | Resp 14 | Ht 60.0 in | Wt 198.1 lb

## 2018-05-29 DIAGNOSIS — E119 Type 2 diabetes mellitus without complications: Secondary | ICD-10-CM | POA: Insufficient documentation

## 2018-05-29 DIAGNOSIS — E559 Vitamin D deficiency, unspecified: Secondary | ICD-10-CM

## 2018-05-29 DIAGNOSIS — Z23 Encounter for immunization: Secondary | ICD-10-CM

## 2018-05-29 DIAGNOSIS — Z1231 Encounter for screening mammogram for malignant neoplasm of breast: Secondary | ICD-10-CM

## 2018-05-29 DIAGNOSIS — Z7189 Other specified counseling: Secondary | ICD-10-CM | POA: Diagnosis not present

## 2018-05-29 DIAGNOSIS — Z0000 Encounter for general adult medical examination without abnormal findings: Secondary | ICD-10-CM | POA: Diagnosis not present

## 2018-05-29 DIAGNOSIS — Z6838 Body mass index (BMI) 38.0-38.9, adult: Secondary | ICD-10-CM | POA: Diagnosis not present

## 2018-05-29 DIAGNOSIS — R7303 Prediabetes: Secondary | ICD-10-CM | POA: Diagnosis not present

## 2018-05-29 DIAGNOSIS — Z01419 Encounter for gynecological examination (general) (routine) without abnormal findings: Secondary | ICD-10-CM | POA: Diagnosis not present

## 2018-05-29 DIAGNOSIS — Z7185 Encounter for immunization safety counseling: Secondary | ICD-10-CM

## 2018-05-29 DIAGNOSIS — F17200 Nicotine dependence, unspecified, uncomplicated: Secondary | ICD-10-CM | POA: Diagnosis not present

## 2018-05-29 NOTE — Patient Instructions (Signed)
I would recommend a mediterranean diet.  A mediterranean diet is high in fruits, vegetables, whole grains, fish, chicken, nuts, healthy fats (olive oil or canola oil). Low fat dairy. Limit butter, margarine, red meat and sweets.    EXERCISE AND DIET:  We recommended that you start or continue a regular exercise program for good health. Regular exercise means any activity that makes your heart beat faster and makes you sweat.  We recommend exercising at least 30 minutes per day at least 3 days a week, preferably 4 or 5.  We also recommend a diet low in fat and sugar.  Inactivity, poor dietary choices and obesity can cause diabetes, heart attack, stroke, and kidney damage, among others.    ALCOHOL AND SMOKING:  Women should limit their alcohol intake to no more than 7 drinks/beers/glasses of wine (combined, not each!) per week. Moderation of alcohol intake to this level decreases your risk of breast cancer and liver damage. And of course, no recreational drugs are part of a healthy lifestyle.  And absolutely no smoking or even second hand smoke. Most people know smoking can cause heart and lung diseases, but did you know it also contributes to weakening of your bones? Aging of your skin?  Yellowing of your teeth and nails?  CALCIUM AND VITAMIN D:  Adequate intake of calcium and Vitamin D are recommended.  The recommendations for exact amounts of these supplements seem to change often, but generally speaking 600 mg of calcium (either carbonate or citrate) and 800 units of Vitamin D per day seems prudent. Certain women may benefit from higher intake of Vitamin D.  If you are among these women, your doctor will have told you during your visit.    PAP SMEARS:  Pap smears, to check for cervical cancer or precancers,  have traditionally been done yearly, although recent scientific advances have shown that most women can have pap smears less often.  However, every woman still should have a physical exam from her  gynecologist every year. It will include a breast check, inspection of the vulva and vagina to check for abnormal growths or skin changes, a visual exam of the cervix, and then an exam to evaluate the size and shape of the uterus and ovaries.  And after 51 years of age, a rectal exam is indicated to check for rectal cancers. We will also provide age appropriate advice regarding health maintenance, like when you should have certain vaccines, screening for sexually transmitted diseases, bone density testing, colonoscopy, mammograms, etc.   MAMMOGRAMS:  All women over 40 years old should have a yearly mammogram. Many facilities now offer a "3D" mammogram, which may cost around $50 extra out of pocket. If possible,  we recommend you accept the option to have the 3D mammogram performed.  It both reduces the number of women who will be called back for extra views which then turn out to be normal, and it is better than the routine mammogram at detecting truly abnormal areas.    COLONOSCOPY:  Colonoscopy to screen for colon cancer is recommended for all women at age 50.  We know, you hate the idea of the prep.  We agree, BUT, having colon cancer and not knowing it is worse!!  Colon cancer so often starts as a polyp that can be seen and removed at colonscopy, which can quite literally save your life!  And if your first colonoscopy is normal and you have no family history of colon cancer, most women don't   have to have it again for 10 years.  Once every ten years, you can do something that may end up saving your life, right?  We will be happy to help you get it scheduled when you are ready.  Be sure to check your insurance coverage so you understand how much it will cost.  It may be covered as a preventative service at no cost, but you should check your particular policy.      

## 2018-05-30 LAB — CBC
HEMATOCRIT: 37.7 % (ref 34.0–46.6)
HEMOGLOBIN: 11.9 g/dL (ref 11.1–15.9)
MCH: 27.9 pg (ref 26.6–33.0)
MCHC: 31.6 g/dL (ref 31.5–35.7)
MCV: 89 fL (ref 79–97)
Platelets: 255 10*3/uL (ref 150–450)
RBC: 4.26 x10E6/uL (ref 3.77–5.28)
RDW: 14.4 % (ref 12.3–15.4)
WBC: 6 10*3/uL (ref 3.4–10.8)

## 2018-05-30 LAB — COMPREHENSIVE METABOLIC PANEL
ALBUMIN: 4 g/dL (ref 3.5–5.5)
ALK PHOS: 99 IU/L (ref 39–117)
ALT: 10 IU/L (ref 0–32)
AST: 16 IU/L (ref 0–40)
Albumin/Globulin Ratio: 1.4 (ref 1.2–2.2)
BILIRUBIN TOTAL: 0.4 mg/dL (ref 0.0–1.2)
BUN / CREAT RATIO: 21 (ref 9–23)
BUN: 15 mg/dL (ref 6–24)
CO2: 22 mmol/L (ref 20–29)
Calcium: 9.5 mg/dL (ref 8.7–10.2)
Chloride: 107 mmol/L — ABNORMAL HIGH (ref 96–106)
Creatinine, Ser: 0.72 mg/dL (ref 0.57–1.00)
GFR calc non Af Amer: 98 mL/min/{1.73_m2} (ref >59)
GFR, EST AFRICAN AMERICAN: 113 mL/min/{1.73_m2} (ref >59)
GLOBULIN, TOTAL: 2.8 g/dL (ref 1.5–4.5)
Glucose: 88 mg/dL (ref 65–99)
Potassium: 3.9 mmol/L (ref 3.5–5.2)
SODIUM: 141 mmol/L (ref 134–144)
Total Protein: 6.8 g/dL (ref 6.0–8.5)

## 2018-05-30 LAB — LIPID PANEL
CHOL/HDL RATIO: 3.6 ratio (ref 0.0–4.4)
Cholesterol, Total: 183 mg/dL (ref 100–199)
HDL: 51 mg/dL (ref >39)
LDL CALC: 116 mg/dL — AB (ref 0–99)
Triglycerides: 82 mg/dL (ref 0–149)
VLDL Cholesterol Cal: 16 mg/dL (ref 5–40)

## 2018-05-30 LAB — HEMOGLOBIN A1C
Est. average glucose Bld gHb Est-mCnc: 128 mg/dL
HEMOGLOBIN A1C: 6.1 % — AB (ref 4.8–5.6)

## 2018-05-30 LAB — VITAMIN D 25 HYDROXY (VIT D DEFICIENCY, FRACTURES): Vit D, 25-Hydroxy: 34.5 ng/mL (ref 30.0–100.0)

## 2018-05-30 LAB — TSH: TSH: 4.28 u[IU]/mL (ref 0.450–4.500)

## 2018-06-25 ENCOUNTER — Ambulatory Visit
Admission: RE | Admit: 2018-06-25 | Discharge: 2018-06-25 | Disposition: A | Discharge status: Home / Self Care | Payer: Managed Care, Other (non HMO) | Source: Ambulatory Visit | Attending: Obstetrics and Gynecology | Admitting: Obstetrics and Gynecology

## 2018-06-25 DIAGNOSIS — Z1231 Encounter for screening mammogram for malignant neoplasm of breast: Secondary | ICD-10-CM

## 2018-06-25 LAB — HM MAMMOGRAPHY

## 2018-07-07 ENCOUNTER — Encounter: Payer: Self-pay | Admitting: Obstetrics and Gynecology

## 2018-07-07 LAB — COLOGUARD

## 2018-07-18 ENCOUNTER — Telehealth: Payer: Self-pay

## 2018-07-18 NOTE — Telephone Encounter (Signed)
Left detailed message for patient that cologuard testing returned negative, okay per ROI. Advised to return call with any additional questions. Encounter closed.

## 2019-03-23 ENCOUNTER — Encounter: Payer: Self-pay | Admitting: Family Medicine

## 2019-03-23 ENCOUNTER — Other Ambulatory Visit: Payer: Self-pay

## 2019-03-23 ENCOUNTER — Ambulatory Visit: Payer: Managed Care, Other (non HMO) | Admitting: Family Medicine

## 2019-03-23 VITALS — BP 118/64 | HR 79 | Temp 98.6°F | Ht 59.0 in | Wt 201.4 lb

## 2019-03-23 DIAGNOSIS — Z13220 Encounter for screening for lipoid disorders: Secondary | ICD-10-CM

## 2019-03-23 DIAGNOSIS — S39012S Strain of muscle, fascia and tendon of lower back, sequela: Secondary | ICD-10-CM | POA: Diagnosis not present

## 2019-03-23 DIAGNOSIS — R7303 Prediabetes: Secondary | ICD-10-CM | POA: Diagnosis not present

## 2019-03-23 DIAGNOSIS — S39012A Strain of muscle, fascia and tendon of lower back, initial encounter: Secondary | ICD-10-CM | POA: Insufficient documentation

## 2019-03-23 DIAGNOSIS — R635 Abnormal weight gain: Secondary | ICD-10-CM | POA: Diagnosis not present

## 2019-03-23 DIAGNOSIS — Z720 Tobacco use: Secondary | ICD-10-CM

## 2019-03-23 DIAGNOSIS — J410 Simple chronic bronchitis: Secondary | ICD-10-CM

## 2019-03-23 LAB — COMPREHENSIVE METABOLIC PANEL
ALT: 11 U/L (ref 0–35)
AST: 12 U/L (ref 0–37)
Albumin: 3.9 g/dL (ref 3.5–5.2)
Alkaline Phosphatase: 87 U/L (ref 39–117)
BUN: 18 mg/dL (ref 6–23)
CO2: 26 mEq/L (ref 19–32)
Calcium: 9.3 mg/dL (ref 8.4–10.5)
Chloride: 109 mEq/L (ref 96–112)
Creatinine, Ser: 0.76 mg/dL (ref 0.40–1.20)
GFR: 96.8 mL/min (ref >60.00)
Glucose, Bld: 83 mg/dL (ref 70–99)
Potassium: 4.4 mEq/L (ref 3.5–5.1)
Sodium: 140 mEq/L (ref 135–145)
Total Bilirubin: 0.3 mg/dL (ref 0.2–1.2)
Total Protein: 6.6 g/dL (ref 6.0–8.3)

## 2019-03-23 LAB — LIPID PANEL
Cholesterol: 189 mg/dL (ref 0–200)
HDL: 48.3 mg/dL (ref >39.00)
LDL Cholesterol: 125 mg/dL — ABNORMAL HIGH (ref 0–99)
NonHDL: 140.2
Total CHOL/HDL Ratio: 4
Triglycerides: 78 mg/dL (ref 0.0–149.0)
VLDL: 15.6 mg/dL (ref 0.0–40.0)

## 2019-03-23 LAB — CBC WITH DIFFERENTIAL/PLATELET
Basophils Absolute: 0 10*3/uL (ref 0.0–0.1)
Basophils Relative: 0.9 % (ref 0.0–3.0)
Eosinophils Absolute: 0.1 10*3/uL (ref 0.0–0.7)
Eosinophils Relative: 2.6 % (ref 0.0–5.0)
HCT: 37.3 % (ref 36.0–46.0)
Hemoglobin: 12.4 g/dL (ref 12.0–15.0)
Lymphocytes Relative: 35.5 % (ref 12.0–46.0)
Lymphs Abs: 1.8 10*3/uL (ref 0.7–4.0)
MCHC: 33.2 g/dL (ref 30.0–36.0)
MCV: 87.7 fl (ref 78.0–100.0)
Monocytes Absolute: 0.4 10*3/uL (ref 0.1–1.0)
Monocytes Relative: 8.7 % (ref 3.0–12.0)
Neutro Abs: 2.6 10*3/uL (ref 1.4–7.7)
Neutrophils Relative %: 52.3 % (ref 43.0–77.0)
Platelets: 231 10*3/uL (ref 150.0–400.0)
RBC: 4.26 Mil/uL (ref 3.87–5.11)
RDW: 14.3 % (ref 11.5–15.5)
WBC: 5 10*3/uL (ref 4.0–10.5)

## 2019-03-23 LAB — TSH: TSH: 4.65 u[IU]/mL — ABNORMAL HIGH (ref 0.35–4.50)

## 2019-03-23 LAB — HEMOGLOBIN A1C: Hgb A1c MFr Bld: 6.5 % (ref 4.6–6.5)

## 2019-03-23 MED ORDER — MELOXICAM 15 MG PO TABS: 15.0000 mg | ORAL_TABLET | Freq: Every day | ORAL | 2 refills | Status: DC

## 2019-03-23 MED ORDER — METHOCARBAMOL 500 MG PO TABS: 500.0000 mg | ORAL_TABLET | Freq: Three times a day (TID) | ORAL | 1 refills | Status: DC | PRN

## 2019-03-23 NOTE — Progress Notes (Signed)
Kylie Brennan is a 53 y.o. female is here to [facility].   Patient Care Team: Helane Rima, DO as PCP - General (Family Medicine)   History of Present Illness:   Kylie Brennan, CMA acting as scribe for Dr. Helane Rima.   HPI: Patient in office to establish care. She was referred here by Dr. Oscar La. She is having some increased low back pain on right side. Works in a factory, labor intensive. Intermittently strains back due to twisting and pulling.  Does exercises, stretches, TENS unit. Uses Mobic and Robaxin sparingly.   Smoker. Started at age 85. Wants to stop - "pray for me." Has been told that she has bronchitis with COPD.   PreDM. Hx of. No labs in > 1 year. Hates needles. Worked on diet and exercise. Lost weight. Hates medications.   Health Maintenance Due  Topic Date Due  . URINE MICROALBUMIN  06/28/1977  . COLONOSCOPY  06/28/2017  . PAP SMEAR-Modifier  04/19/2019   No flowsheet data found.  PMHx, SurgHx, SocialHx, Medications, and Allergies were reviewed in the Visit Navigator and updated as appropriate.   Past Medical History:  Diagnosis Date  . Anemia   . Anxiety   . COPD (chronic obstructive pulmonary disease) (HCC)   . Depression   . GERD (gastroesophageal reflux disease)   . Osteoporosis   . Prediabetes   . Substance abuse [facility])      Past Surgical History:  Procedure Laterality Date  . CESAREAN SECTION    . COLPOSCOPY       Family History  Problem Relation Age of Onset  . Breast cancer Mother   . Hypertension Mother   . Stroke Mother   . Alcohol abuse Father   . Throat cancer Father   . Colon cancer Neg Hx   . Kidney disease Neg Hx   . Hyperlipidemia Neg Hx   . Heart disease Neg Hx   . COPD Neg Hx   . Diabetes Neg Hx   . Drug abuse Neg Hx   . Early death Neg Hx   . Hearing loss Neg Hx     Social History   Tobacco Use  . Smoking status: Current Every Day Smoker    Packs/day: 0.50    Years: 30.00    Pack years: 15.00   Types: Cigarettes  . Smokeless tobacco: Never Used  Substance Use Topics  . Alcohol use: No    Alcohol/week: 0.0 standard drinks  . Drug use: Yes    Types: Marijuana    Current Medications and Allergies   Current Outpatient Medications:  .  cetirizine (ZYRTEC) 10 MG tablet, Take 10 mg by mouth., Disp: , Rfl:  .  Flaxseed, Linseed, (FLAX SEEDS PO), Take by mouth., Disp: , Rfl:  .  meloxicam (MOBIC) 15 MG tablet, Take 1 tablet (15 mg total) by mouth daily., Disp: 30 tablet, Rfl: 2 .  methocarbamol (ROBAXIN) 500 MG tablet, Take 1 tablet (500 mg total) by mouth every 8 (eight) hours as needed for muscle spasms., Disp: 30 tablet, Rfl: 1   Allergies  Allergen Reactions  . Amoxicillin Rash   Review of Systems   Pertinent items are noted in the HPI. Otherwise, a complete ROS is negative.  Vitals   Vitals:   03/23/19 1015  BP: 118/64  Pulse: 79  Temp: 98.6 F (37 C)  TempSrc: Oral  SpO2: 98%  Weight: 201 lb 6.4 oz (91.4 kg)  Height: 4\' 11"  (1.499 m)  Body mass index is 40.68 kg/m.  Physical Exam   Physical Exam Vitals signs and nursing note reviewed.  Constitutional:      General: She is not in acute distress.    Appearance: Normal appearance.  HENT:     Head: Normocephalic and atraumatic.     Nose: Nose normal.  Eyes:     Pupils: Pupils are equal, round, and reactive to light.  Neck:     Musculoskeletal: Normal range of motion and neck supple.  Cardiovascular:     Rate and Rhythm: Normal rate and regular rhythm.     Heart sounds: Normal heart sounds.  Pulmonary:     Effort: Pulmonary effort is normal.  Abdominal:     Palpations: Abdomen is soft.  Skin:    General: Skin is warm.     Capillary Refill: Capillary refill takes less than 2 seconds.  Neurological:     General: No focal deficit present.     Mental Status: She is alert.  Psychiatric:        Mood and Affect: Mood normal.        Behavior: Behavior normal.    Assessment and Plan   Kylie Brennan was  seen today for establish care.  Diagnoses and all orders for this visit:  Strain of lumbar region, sequela -     meloxicam (MOBIC) 15 MG tablet; Take 1 tablet (15 mg total) by mouth daily. -     methocarbamol (ROBAXIN) 500 MG tablet; Take 1 tablet (500 mg total) by mouth every 8 (eight) hours as needed for muscle spasms.  Prediabetes -     Hemoglobin A1c  Tobacco use History: Social History   Tobacco Use  . Smoking status: Current Every Day Smoker    Packs/day: 0.50    Years: 30.00    Pack years: 15.00    Types: Cigarettes  . Smokeless tobacco: Never Used  Substance Use Topics  . Alcohol use: No    Alcohol/week: 0.0 standard drinks   Assessment/Plan: The patient was counseled on the dangers of tobacco use, and was advised to quit.  Reviewed strategies to maximize success, including removing cigarettes and smoking materials from environment, stress management, support of family/friends, written materials, local smoking cessation programs (1-800-QUIT-NOW and SMOKEFREE.GOV) and pharmacotherapy.   -     CBC with Differential/Platelet -     Comprehensive metabolic panel  Screening for lipid disorders -     Lipid panel  Weight gain -     TSH  Simple chronic bronchitis (HCC)  Severe obesity (BMI >= 40) (HCC) The patient is asked to make an attempt to improve diet and exercise patterns to aid in medical management of this problem.   . Orders and follow up as documented in EpicCare, reviewed diet, exercise and weight control, cardiovascular risk and specific lipid/LDL goals reviewed, reviewed medications and side effects in detail.  . Reviewed expectations re: course of current medical issues. . Outlined signs and symptoms indicating need for more acute intervention. . Patient verbalized understanding and all questions were answered. . Patient received an After Visit Summary.  CMA served as Neurosurgeon during this visit. History, Physical, and Plan performed by medical provider.  The above documentation has been reviewed and is accurate and complete. Helane Rima, D.O.  Helane Rima, DO , [facility] 03/23/2019

## 2019-03-31 ENCOUNTER — Ambulatory Visit (INDEPENDENT_AMBULATORY_CARE_PROVIDER_SITE_OTHER): Payer: 59 | Admitting: Physician Assistant

## 2019-03-31 ENCOUNTER — Encounter: Payer: Self-pay | Admitting: Physician Assistant

## 2019-03-31 DIAGNOSIS — Z713 Dietary counseling and surveillance: Secondary | ICD-10-CM

## 2019-03-31 DIAGNOSIS — R7303 Prediabetes: Secondary | ICD-10-CM | POA: Diagnosis not present

## 2019-03-31 NOTE — Progress Notes (Signed)
Virtual Visit via Video   I connected with Kylie Brennan on 03/31/19 at 10:20 AM EDT by a video enabled telemedicine application and verified that I am speaking with the correct person using two identifiers. Location patient: Home Location provider: [facility], Office Persons participating in the virtual visit: Kylie Brennan, Kylie MottoSamantha Kuron Docken, PA, Kylie Brennan, [place]-C  I discussed the limitations of evaluation and management by telemedicine and the availability of in person appointments. The patient expressed understanding and agreed to proceed.  Subjective:   HPI:   Nutrition Counseling Pt is here to discuss nutrition and diet. She was recently diagnosed with diabetes.  Lab Results  Component Value Date   HGBA1C 6.5 03/23/2019   Wt Readings from Last 5 Encounters:  03/23/19 201 lb 6.4 oz (91.4 kg)  05/29/18 198 lb 1.6 oz (89.9 kg)  01/15/18 192 lb (87.1 kg)  01/07/18 190 lb (86.2 kg)  04/18/17 197 lb (89.4 kg)   Dietary recall: Wakes up at 10 am Breakfast -- 2pm -- cheerios with milk and fruit OR breakfast sandwich from [employer] Lunch --  5pm -- chips or nabs Dinner -- 8pm -- leftovers (meat/chicken) + sides (mashed potatoes, [place] rice, corn, green beans) Snacks -- 12am -- fruit, oreos Beverages -- occasional regular soda (coke or gingerale), juice, Coors light (2/week)  Weight: Wt Readings from Last 3 Encounters:  03/23/19 201 lb 6.4 oz (91.4 kg)  05/29/18 198 lb 1.6 oz (89.9 kg)  01/15/18 192 lb (87.1 kg)    Exercise: As able, was in a group that got cancelled due to COVID  Support system: mom  Sleep: Variable given menopause, denies any night time eating  Goals: 1- lose weight 2- get DM under control 3- avoid insulin  Estimated daily energy needs: Calories: 1500 - 1800 kcal Protein: 60-75 g Fluid: 1800-2000 ml   ROS: See pertinent positives and negatives per HPI.  Patient Active Problem List   Diagnosis Date Noted  . Simple chronic bronchitis  (HCC) 03/23/2019  . Strain of lumbar region 03/23/2019  . Prediabetes   . Routine general medical examination at a health care facility 07/08/2013  . Other screening mammogram 07/08/2013  . ECZEMA 04/24/2010  . Severe obesity (BMI >= 40) (HCC) 03/30/2010  . Tobacco use 09/08/2008    Social History   Tobacco Use  . Smoking status: Current Every Day Smoker    Packs/day: 0.50    Years: 30.00    Pack years: 15.00    Types: Cigarettes  . Smokeless tobacco: Never Used  Substance Use Topics  . Alcohol use: No    Alcohol/week: 0.0 standard drinks    Current Outpatient Medications:  .  cetirizine (ZYRTEC) 10 MG tablet, Take 10 mg by mouth., Disp: , Rfl:  .  Flaxseed, Linseed, (FLAX SEEDS PO), Take by mouth., Disp: , Rfl:  .  meloxicam (MOBIC) 15 MG tablet, Take 1 tablet (15 mg total) by mouth daily., Disp: 30 tablet, Rfl: 2 .  methocarbamol (ROBAXIN) 500 MG tablet, Take 1 tablet (500 mg total) by mouth every 8 (eight) hours as needed for muscle spasms., Disp: 30 tablet, Rfl: 1  Allergies  Allergen Reactions  . Amoxicillin Rash    Objective:   VITALS: Per patient if applicable, see vitals. GENERAL: Alert, appears well and in no acute distress. HEENT: Atraumatic, conjunctiva clear, no obvious abnormalities on inspection of external nose and ears. NECK: Normal movements of the head and neck. CARDIOPULMONARY: No increased WOB. Speaking in clear sentences.  I:E ratio WNL.  MS: Moves all visible extremities without noticeable abnormality. PSYCH: Pleasant and cooperative, well-groomed. Speech normal rate and rhythm. Affect is appropriate. Insight and judgement are appropriate. Attention is focused, linear, and appropriate.  NEURO: CN grossly intact. Oriented as arrived to appointment on time with no prompting. Moves both UE equally.  SKIN: No obvious lesions, wounds, erythema, or cyanosis noted on face or hands.  Assessment and Plan:   Lareese was seen today for nutrition counseling.   Diagnoses and all orders for this visit:  Encounter for nutritional counseling  Prediabetes  Severe obesity (BMI >= 40) (HCC)   Discussed specific, individualized recommendations regarding nutrition including decreasing sugary beverages, limiting portions, balancing out meals, and eating regularly throughout the day. Handouts provided included: Balanced Plate, Balanced Snack List, Balanced Breakfast -- to be mailed to patient. Provided emotional support and encouraged slow, steady weight loss. Patient's questions answered throughout encounter. Follow-up with me prn.   . Reviewed expectations re: course of current medical issues. . Discussed self-management of symptoms. . Outlined signs and symptoms indicating need for more acute intervention. . Patient verbalized understanding and all questions were answered. Marland Kitchen Health Maintenance issues including appropriate healthy diet, exercise, and smoking avoidance were discussed with patient. . See orders for this visit as documented in the electronic medical record.  I discussed the assessment and treatment plan with the patient. The patient was provided an opportunity to ask questions and all were answered. The patient agreed with the plan and demonstrated an understanding of the instructions.   The patient was advised to call back or seek an in-person evaluation if the symptoms worsen or if the condition fails to improve as anticipated.  CMA or LPN served as scribe during this visit. History, Physical, and Plan performed by medical provider. The above documentation has been reviewed and is accurate and complete.    Gisela, [place] 03/31/2019

## 2019-03-31 NOTE — Patient Instructions (Signed)
It was great to see you!  Goals for our visit: 1. Stretches at least 3 times a week -- Mon, Wed, Fri 2. Get moving -- work on going on walks or other safe activities 3. Continue to drink plenty of water 4. Limit juices, sodas, and wine as much as possible 5. Increase the protein content of your breakfast (see breakfast handout) 6. Only have 1 carbohydrate source at meal times -- and keep the portion the size of computer mouse or smaller   Take care,  Jarold Motto PA-C

## 2019-04-18 ENCOUNTER — Encounter: Payer: Self-pay | Admitting: Family Medicine

## 2019-06-11 NOTE — Progress Notes (Signed)
53 y.o. [id number] Single Black or African American Not Hispanic or Latino female here for annual exam.  No vaginal bleeding. Tolerable vasomotor symptoms, avoids triggers. Not currently sexually active, she was in the last year.   She lives with her Mother, in remission from breast cancer for almost 3 years. She has severe gout, trouble walking. Mom is 45.   No LMP recorded. Patient is postmenopausal.          Sexually active: No.  The current method of family planning is post menopausal status.    Exercising: Yes.    stretches, resistence bands Smoker:  Yes, 1 pack a week, cutting back.   Health Maintenance: Pap: 04-18-16 Neg:Neg HR HPV History of abnormal Pap:  Yes, Hx of colpo 1993 MMG: 06/25/2018 Birads 1 negative Colonoscopy:  07/07/2018 Negative BMD:   n/a TDaP:  05/29/2018 Gardasil: n/a    reports that she has been smoking cigarettes. She has a 7.50 pack-year smoking history. She has never used smokeless tobacco. She reports current drug use. Drug: Marijuana. She reports that she does not drink alcohol. She works for a [employer]. 2 grown kids, 66 year old grandson. Son's wife is due with a little boy.   Past Medical History:  Diagnosis Date  . Anemia   . Anxiety   . Blood transfusion without reported diagnosis   . Chicken pox   . COPD (chronic obstructive pulmonary disease) ([place])   . Depression   . GERD (gastroesophageal reflux disease)   . Osteoporosis   . Prediabetes   . Substance abuse [facility])     Past Surgical History:  Procedure Laterality Date  . CESAREAN SECTION    . COLPOSCOPY      Current Outpatient Medications  Medication Sig Dispense Refill  . acetaminophen (TYLENOL) 650 MG CR tablet Take 650 mg by mouth every 8 (eight) hours as needed for pain.    . cetirizine (ZYRTEC) 10 MG tablet Take 10 mg by mouth.    . Flaxseed, Linseed, (FLAX SEEDS PO) Take by mouth.    . meloxicam (MOBIC) 15 MG tablet Take 1 tablet (15 mg total) by mouth daily. 30 tablet 2  .  methocarbamol (ROBAXIN) 500 MG tablet Take 1 tablet (500 mg total) by mouth every 8 (eight) hours as needed for muscle spasms. 30 tablet 1   No current facility-administered medications for this visit.     Family History  Problem Relation Age of Onset  . Breast cancer Mother   . Hypertension Mother   . Stroke Mother   . Gout Mother   . Alcohol abuse Father   . Throat cancer Father   . Drug abuse Sister   . Diabetes Maternal Grandmother   . Cancer Maternal Grandfather   . Early death Maternal Grandfather   . Diabetes Paternal Grandmother   . Alcohol abuse Paternal Grandfather   . Early death Paternal Grandfather   . Colon cancer Neg Hx   . Kidney disease Neg Hx   . Hyperlipidemia Neg Hx   . Heart disease Neg Hx   . COPD Neg Hx   . Hearing loss Neg Hx     Review of Systems  Constitutional: Negative.   HENT: Negative.   Eyes: Negative.   Respiratory: Negative.   Cardiovascular: Negative.   Gastrointestinal: Negative.   Endocrine: Negative.   Genitourinary: Negative.   Musculoskeletal: Negative.   Skin: Negative.   Allergic/Immunologic: Negative.   Neurological: Negative.   Hematological: Negative.   Psychiatric/Behavioral: Negative.  Exam:   BP 122/84 (BP Location: Right Arm, Patient Position: Sitting, Cuff Size: Large)   Pulse 72   Temp 98.2 F (36.8 C) (Skin)   Ht 5' (1.524 m)   Wt 201 lb 12.8 oz (91.5 kg)   BMI 39.41 kg/m   Weight change: @WEIGHTCHANGE @ Height:   Height: 5' (152.4 cm)  Ht Readings from Last 3 Encounters:  06/15/19 5' (1.524 m)  06/12/19 4\' 11"  (1.499 m)  03/23/19 4\' 11"  (1.499 m)    General appearance: alert, cooperative and appears stated age Head: Normocephalic, without obvious abnormality, atraumatic Neck: no adenopathy, supple, symmetrical, trachea midline and thyroid normal to inspection and palpation Lungs: clear to auscultation bilaterally Cardiovascular: regular rate and rhythm Breasts: normal appearance, no masses or  tenderness Abdomen: soft, non-tender; non distended,  no masses,  no organomegaly Extremities: extremities normal, atraumatic, no cyanosis or edema Skin: Skin color, texture, turgor normal. No rashes or lesions Lymph nodes: Cervical, supraclavicular, and axillary nodes normal. No abnormal inguinal nodes palpated Neurologic: Grossly normal   Pelvic: External genitalia:  no lesions              Urethra:  normal appearing urethra with no masses, tenderness or lesions              Bartholins and Skenes: normal                 Vagina: normal appearing vagina with normal color and discharge, no lesions              Cervix: no lesions               Bimanual Exam:  Uterus:  no masses or tenderness, limited by BMI              Adnexa: no mass, fullness, tenderness               Rectovaginal: Confirms               Anus:  normal sphincter tone, no lesions  Chaperone was present for exam.  A:  Well Woman with normal exam  Recent blood work c/w diabetes. Working on diet  Smoker  BMI 39    P:   Pap with hpv  Labs with primary  Mammogram due next month  Colonoscopy UTD  Discussed breast self exam  Discussed calcium and vit D intake  Screening STD

## 2019-06-12 ENCOUNTER — Other Ambulatory Visit: Payer: Self-pay

## 2019-06-12 ENCOUNTER — Encounter: Payer: Self-pay | Admitting: Family Medicine

## 2019-06-12 ENCOUNTER — Ambulatory Visit (INDEPENDENT_AMBULATORY_CARE_PROVIDER_SITE_OTHER): Payer: 59 | Admitting: Family Medicine

## 2019-06-12 VITALS — Ht 59.0 in | Wt 201.0 lb

## 2019-06-12 DIAGNOSIS — M62838 Other muscle spasm: Secondary | ICD-10-CM

## 2019-06-12 DIAGNOSIS — Z720 Tobacco use: Secondary | ICD-10-CM | POA: Diagnosis not present

## 2019-06-12 DIAGNOSIS — R7303 Prediabetes: Secondary | ICD-10-CM | POA: Diagnosis not present

## 2019-06-12 DIAGNOSIS — R7989 Other specified abnormal findings of blood chemistry: Secondary | ICD-10-CM

## 2019-06-12 NOTE — Progress Notes (Signed)
Virtual Visit via Video   Due to the COVID-19 pandemic, this visit was completed with telemedicine (audio/video) technology to reduce patient and provider exposure as well as to preserve personal protective equipment.   I connected with Kylie Brennan by a video enabled telemedicine application and verified that I am speaking with the correct person using two identifiers. Location patient: Home Location provider: [facility], Office Persons participating in the virtual visit: Kylie Brennan, Kylie RimaErica [name], DO Barnie MortJoEllen Thompson, CMA acting as scribe for Dr. Helane RimaErica Alia Parsley.   I discussed the limitations of evaluation and management by telemedicine and the availability of in person appointments. The patient expressed understanding and agreed to proceed.  Care Team   Patient Care Team: Kylie Brennan, Harbor Vanover, DO as PCP - General (Family Medicine) [facility] as Consulting Physician (Ophthalmology) Romualdo BolkJertson, Jill Evelyn, MD as Consulting Physician (Obstetrics and Gynecology) Felecia ShellingEvans, Brent M, DPM as Consulting Physician (Podiatry) [facility] as Consulting Physician (Dentistry)  Subjective:   HPI: See Assessment and Plan section for Problem Based Charting of issues discussed today.   Review of Systems  Constitutional: Negative for chills and fever.  HENT: Negative for hearing loss and tinnitus.   Eyes: Negative for blurred vision and double vision.  Respiratory: Negative for cough and hemoptysis.   Cardiovascular: Negative for chest pain, palpitations and leg swelling.  Gastrointestinal: Negative for heartburn, nausea and vomiting.  Genitourinary: Negative for dysuria, frequency and urgency.  Musculoskeletal: Negative for myalgias and neck pain.  Neurological: Negative for dizziness and headaches.  Endo/Heme/Allergies: Does not bruise/bleed easily.  Psychiatric/Behavioral: Negative for depression and suicidal ideas.    Patient Active Problem List   Diagnosis Date Noted  . Simple  chronic bronchitis (HCC) 03/23/2019  . Prediabetes   . Severe obesity (BMI >= 40) (HCC) 03/30/2010  . Tobacco use 09/08/2008    Social History   Tobacco Use  . Smoking status: Current Every Day Smoker    Packs/day: 0.50    Years: 30.00    Pack years: 15.00    Types: Cigarettes  . Smokeless tobacco: Never Used  Substance Use Topics  . Alcohol use: No    Alcohol/week: 0.0 standard drinks    Current Outpatient Medications:  .  cetirizine (ZYRTEC) 10 MG tablet, Take 10 mg by mouth., Disp: , Rfl:  .  Flaxseed, Linseed, (FLAX SEEDS PO), Take by mouth., Disp: , Rfl:  .  meloxicam (MOBIC) 15 MG tablet, Take 1 tablet (15 mg total) by mouth daily., Disp: 30 tablet, Rfl: 2 .  methocarbamol (ROBAXIN) 500 MG tablet, Take 1 tablet (500 mg total) by mouth every 8 (eight) hours as needed for muscle spasms., Disp: 30 tablet, Rfl: 1  Allergies  Allergen Reactions  . Amoxicillin Rash   Objective:   VITALS: Per patient if applicable, see vitals. GENERAL: Alert, appears well and in no acute distress. HEENT: Atraumatic, conjunctiva clear, no obvious abnormalities on inspection of external nose and ears. NECK: Normal movements of the head and neck. CARDIOPULMONARY: No increased WOB. Speaking in clear sentences. I:E ratio WNL.  MS: Moves all visible extremities without noticeable abnormality. PSYCH: Pleasant and cooperative, well-groomed. Speech normal rate and rhythm. Affect is appropriate. Insight and judgement are appropriate. Attention is focused, linear, and appropriate.  NEURO: CN grossly intact. Oriented as arrived to appointment on time with no prompting. Moves both UE equally.  SKIN: No obvious lesions, wounds, erythema, or cyanosis noted on face or hands.  [facility] 06/12/2019 04/18/2019  Decreased  Interest 0 1  Down, Depressed, Hopeless 0 0  PHQ - 2 Score 0 1  Altered sleeping 0 2  Tired, decreased energy 0 0  Change in appetite 0 0  Feeling bad or failure about  yourself  0 0  Trouble concentrating 0 0  Moving slowly or fidgety/restless 0 0  Suicidal thoughts 0 0  PHQ-9 Score 0 3  Difficult doing work/chores Not difficult at all Somewhat difficult    Assessment and Plan:   1. Severe obesity (BMI >= 40) (HCC) The patient is asked to make an attempt to improve diet and exercise patterns to aid in medical management of this problem.   2. Tobacco use History: Social History   Tobacco Use  . Smoking status: Current Every Day Smoker    Packs/day: 0.50    Years: 30.00    Pack years: 15.00    Types: Cigarettes  . Smokeless tobacco: Never Used  Substance Use Topics  . Alcohol use: No    Alcohol/week: 0.0 standard drinks   Assessment/Plan: The patient was counseled on the dangers of tobacco use, and was advised to quit.  Reviewed strategies to maximize success, including removing cigarettes and smoking materials from environment, stress management, support of family/friends, written materials, local smoking cessation programs (1-800-QUIT-NOW and SMOKEFREE.GOV) and pharmacotherapy. Greater than 3 minutes were spent on counseling today.  3. Prediabetes History: Weight trend: decreasing steadily. Prior visit with dietician: yes - Inda Coke. Current diet: in general, a "healthy" diet.   Current exercise: none.  Lab Results  Component Value Date   HGBA1C 6.5 03/23/2019   HGBA1C 6.1 (H) 05/29/2018   HGBA1C 5.7 (H) 04/18/2017   GLUCOSE 83 03/23/2019   GLUCOSE 88 05/29/2018   GLUCOSE 94 04/18/2017   Assessment/Plan: 1. Maude will continue to work on weight loss, exercise, and decreasing simple carbohydrates in her diet to help decrease the risk of diabetes.  2. Medication: none.  - CBC with Differential/Platelet; Future - Comprehensive metabolic panel; Future - Hemoglobin A1c; Future  4. Elevated TSH Endocrine ROS: negative for - breast changes, galactorrhea, hair pattern changes, hot flashes, malaise/lethargy, mood swings,  palpitations, polydipsia/polyuria, skin changes, temperature intolerance or unexpected weight changes.  - TSH; Future - T4, free; Future  5. Muscle spasm She has a physically demanding job. Lifting and repetitive motion daily. Uses Robaxin prn. Causes some morning sedation. Discussed possible change to Baclofen.  - CBC with Differential/Platelet; Future - Comprehensive metabolic panel; Future  . COVID-19 Education: The signs and symptoms of COVID-19 were discussed with the patient and how to seek care for testing if needed. The importance of social distancing was discussed today. . Reviewed expectations re: course of current medical issues. . Discussed self-management of symptoms. . Outlined signs and symptoms indicating need for more acute intervention. . Patient verbalized understanding and all questions were answered. Marland Kitchen Health Maintenance issues including appropriate healthy diet, exercise, and smoking avoidance were discussed with patient. . See orders for this visit as documented in the electronic medical record.  Briscoe Deutscher, DO  Records requested if needed. Time spent: 25 minutes, of which >50% was spent in obtaining information about her symptoms, reviewing her previous labs, evaluations, and treatments, counseling her about her condition (please see the discussed topics above), and developing a plan to further investigate it; she had a number of questions which I addressed.

## 2019-06-15 ENCOUNTER — Other Ambulatory Visit: Payer: Self-pay

## 2019-06-15 ENCOUNTER — Other Ambulatory Visit (HOSPITAL_COMMUNITY)
Admission: RE | Admit: 2019-06-15 | Discharge: 2019-06-15 | Disposition: A | Discharge status: Home / Self Care | Payer: 59 | Source: Ambulatory Visit | Attending: Obstetrics and Gynecology | Admitting: Obstetrics and Gynecology

## 2019-06-15 ENCOUNTER — Ambulatory Visit (INDEPENDENT_AMBULATORY_CARE_PROVIDER_SITE_OTHER): Payer: 59 | Admitting: Obstetrics and Gynecology

## 2019-06-15 ENCOUNTER — Encounter: Payer: Self-pay | Admitting: Obstetrics and Gynecology

## 2019-06-15 VITALS — BP 122/84 | HR 72 | Temp 98.2°F | Ht 60.0 in | Wt 201.8 lb

## 2019-06-15 DIAGNOSIS — Z113 Encounter for screening for infections with a predominantly sexual mode of transmission: Secondary | ICD-10-CM | POA: Insufficient documentation

## 2019-06-15 DIAGNOSIS — E559 Vitamin D deficiency, unspecified: Secondary | ICD-10-CM | POA: Diagnosis not present

## 2019-06-15 DIAGNOSIS — Z01419 Encounter for gynecological examination (general) (routine) without abnormal findings: Secondary | ICD-10-CM | POA: Diagnosis not present

## 2019-06-15 DIAGNOSIS — Z124 Encounter for screening for malignant neoplasm of cervix: Secondary | ICD-10-CM

## 2019-06-15 NOTE — Patient Instructions (Signed)
EXERCISE AND DIET:  We recommended that you start or continue a regular exercise program for good health. Regular exercise means any activity that makes your heart beat faster and makes you sweat.  We recommend exercising at least 30 minutes per day at least 3 days a week, preferably 4 or 5.  We also recommend a diet low in fat and sugar.  Inactivity, poor dietary choices and obesity can cause diabetes, heart attack, stroke, and kidney damage, among others.    ALCOHOL AND SMOKING:  Women should limit their alcohol intake to no more than 7 drinks/beers/glasses of wine (combined, not each!) per week. Moderation of alcohol intake to this level decreases your risk of breast cancer and liver damage. And of course, no recreational drugs are part of a healthy lifestyle.  And absolutely no smoking or even second hand smoke. Most people know smoking can cause heart and lung diseases, but did you know it also contributes to weakening of your bones? Aging of your skin?  Yellowing of your teeth and nails?  CALCIUM AND VITAMIN D:  Adequate intake of calcium and Vitamin D are recommended.  The recommendations for exact amounts of these supplements seem to change often, but generally speaking 1,200 mg of calcium (between diet and supplement) and 800 units of Vitamin D per day seems prudent. Certain women may benefit from higher intake of Vitamin D.  If you are among these women, your doctor will have told you during your visit.    PAP SMEARS:  Pap smears, to check for cervical cancer or precancers,  have traditionally been done yearly, although recent scientific advances have shown that most women can have pap smears less often.  However, every woman still should have a physical exam from her gynecologist every year. It will include a breast check, inspection of the vulva and vagina to check for abnormal growths or skin changes, a visual exam of the cervix, and then an exam to evaluate the size and shape of the uterus and  ovaries.  And after 52 years of age, a rectal exam is indicated to check for rectal cancers. We will also provide age appropriate advice regarding health maintenance, like when you should have certain vaccines, screening for sexually transmitted diseases, bone density testing, colonoscopy, mammograms, etc.   MAMMOGRAMS:  All women over 40 years old should have a yearly mammogram. Many facilities now offer a "3D" mammogram, which may cost around $50 extra out of pocket. If possible,  we recommend you accept the option to have the 3D mammogram performed.  It both reduces the number of women who will be called back for extra views which then turn out to be normal, and it is better than the routine mammogram at detecting truly abnormal areas.    COLON CANCER SCREENING: Now recommend starting at age 45. At this time colonoscopy is not covered for routine screening until 50. There are take home tests that can be done between 45-49.   COLONOSCOPY:  Colonoscopy to screen for colon cancer is recommended for all women at age 50.  We know, you hate the idea of the prep.  We agree, BUT, having colon cancer and not knowing it is worse!!  Colon cancer so often starts as a polyp that can be seen and removed at colonscopy, which can quite literally save your life!  And if your first colonoscopy is normal and you have no family history of colon cancer, most women don't have to have it again for   10 years.  Once every ten years, you can do something that may end up saving your life, right?  We will be happy to help you get it scheduled when you are ready.  Be sure to check your insurance coverage so you understand how much it will cost.  It may be covered as a preventative service at no cost, but you should check your particular policy.      Breast Self-Awareness Breast self-awareness means being familiar with how your breasts look and feel. It involves checking your breasts regularly and reporting any changes to your  health care provider. Practicing breast self-awareness is important. A change in your breasts can be a sign of a serious medical problem. Being familiar with how your breasts look and feel allows you to find any problems early, when treatment is more likely to be successful. All women should practice breast self-awareness, including women who have had breast implants. How to do a breast self-exam One way to learn what is normal for your breasts and whether your breasts are changing is to do a breast self-exam. To do a breast self-exam: Look for Changes  1. Remove all the clothing above your waist. 2. Stand in front of a mirror in a room with good lighting. 3. Put your hands on your hips. 4. Push your hands firmly downward. 5. Compare your breasts in the mirror. Look for differences between them (asymmetry), such as: ? Differences in shape. ? Differences in size. ? Puckers, dips, and bumps in one breast and not the other. 6. Look at each breast for changes in your skin, such as: ? Redness. ? Scaly areas. 7. Look for changes in your nipples, such as: ? Discharge. ? Bleeding. ? Dimpling. ? Redness. ? A change in position. Feel for Changes Carefully feel your breasts for lumps and changes. It is best to do this while lying on your back on the floor and again while sitting or standing in the shower or tub with soapy water on your skin. Feel each breast in the following way:  Place the arm on the side of the breast you are examining above your head.  Feel your breast with the other hand.  Start in the nipple area and make  inch (2 cm) overlapping circles to feel your breast. Use the pads of your three middle fingers to do this. Apply light pressure, then medium pressure, then firm pressure. The light pressure will allow you to feel the tissue closest to the skin. The medium pressure will allow you to feel the tissue that is a little deeper. The firm pressure will allow you to feel the tissue  close to the ribs.  Continue the overlapping circles, moving downward over the breast until you feel your ribs below your breast.  Move one finger-width toward the center of the body. Continue to use the  inch (2 cm) overlapping circles to feel your breast as you move slowly up toward your collarbone.  Continue the up and down exam using all three pressures until you reach your armpit.  Write Down What You Find  Write down what is normal for each breast and any changes that you find. Keep a written record with breast changes or normal findings for each breast. By writing this information down, you do not need to depend only on memory for size, tenderness, or location. Write down where you are in your menstrual cycle, if you are still menstruating. If you are having trouble noticing differences   in your breasts, do not get discouraged. With time you will become more familiar with the variations in your breasts and more comfortable with the exam. How often should I examine my breasts? Examine your breasts every month. If you are breastfeeding, the best time to examine your breasts is after a feeding or after using a breast pump. If you menstruate, the best time to examine your breasts is 5-7 days after your period is over. During your period, your breasts are lumpier, and it may be more difficult to notice changes. When should I see my health care provider? See your health care provider if you notice:  A change in shape or size of your breasts or nipples.  A change in the skin of your breast or nipples, such as a reddened or scaly area.  Unusual discharge from your nipples.  A lump or thick area that was not there before.  Pain in your breasts.  Anything that concerns you.  

## 2019-06-16 LAB — CYTOLOGY - PAP
Adequacy: ABSENT
Chlamydia: NEGATIVE
Diagnosis: NEGATIVE
HPV: NOT DETECTED
Neisseria Gonorrhea: NEGATIVE
Trichomonas: NEGATIVE

## 2019-06-16 LAB — HEPATITIS C ANTIBODY: Hep C Virus Ab: 0.1 s/co ratio (ref 0.0–0.9)

## 2019-06-16 LAB — RPR: RPR Ser Ql: NONREACTIVE

## 2019-06-16 LAB — VITAMIN D 25 HYDROXY (VIT D DEFICIENCY, FRACTURES): Vit D, 25-Hydroxy: 38.7 ng/mL (ref 30.0–100.0)

## 2019-06-16 LAB — HIV ANTIBODY (ROUTINE TESTING W REFLEX): HIV Screen 4th Generation wRfx: NONREACTIVE

## 2019-07-07 ENCOUNTER — Other Ambulatory Visit: Payer: Self-pay | Admitting: Obstetrics and Gynecology

## 2019-07-07 DIAGNOSIS — Z1231 Encounter for screening mammogram for malignant neoplasm of breast: Secondary | ICD-10-CM

## 2019-08-24 ENCOUNTER — Ambulatory Visit
Admission: RE | Admit: 2019-08-24 | Discharge: 2019-08-24 | Disposition: A | Discharge status: Home / Self Care | Payer: 59 | Source: Ambulatory Visit | Attending: Obstetrics and Gynecology | Admitting: Obstetrics and Gynecology

## 2019-08-24 ENCOUNTER — Other Ambulatory Visit: Payer: Self-pay

## 2019-08-24 DIAGNOSIS — Z1231 Encounter for screening mammogram for malignant neoplasm of breast: Secondary | ICD-10-CM

## 2019-08-26 ENCOUNTER — Other Ambulatory Visit: Payer: Self-pay | Admitting: Obstetrics and Gynecology

## 2019-08-26 DIAGNOSIS — R928 Other abnormal and inconclusive findings on diagnostic imaging of breast: Secondary | ICD-10-CM

## 2019-08-28 ENCOUNTER — Ambulatory Visit: Payer: 59 | Admitting: Family Medicine

## 2019-09-01 ENCOUNTER — Ambulatory Visit
Admission: RE | Admit: 2019-09-01 | Discharge: 2019-09-01 | Disposition: A | Discharge status: Home / Self Care | Payer: 59 | Source: Ambulatory Visit | Attending: Obstetrics and Gynecology | Admitting: Obstetrics and Gynecology

## 2019-09-01 ENCOUNTER — Other Ambulatory Visit: Payer: Self-pay

## 2019-09-01 ENCOUNTER — Other Ambulatory Visit: Payer: Self-pay | Admitting: Obstetrics and Gynecology

## 2019-09-01 DIAGNOSIS — R921 Mammographic calcification found on diagnostic imaging of breast: Secondary | ICD-10-CM

## 2019-09-01 DIAGNOSIS — R928 Other abnormal and inconclusive findings on diagnostic imaging of breast: Secondary | ICD-10-CM

## 2019-09-07 ENCOUNTER — Ambulatory Visit
Admission: RE | Admit: 2019-09-07 | Discharge: 2019-09-07 | Disposition: A | Discharge status: Home / Self Care | Payer: 59 | Source: Ambulatory Visit | Attending: Obstetrics and Gynecology | Admitting: Obstetrics and Gynecology

## 2019-09-07 ENCOUNTER — Other Ambulatory Visit: Payer: Self-pay

## 2019-09-07 DIAGNOSIS — R921 Mammographic calcification found on diagnostic imaging of breast: Secondary | ICD-10-CM

## 2019-10-02 ENCOUNTER — Encounter: Payer: Self-pay | Admitting: Physician Assistant

## 2019-10-02 ENCOUNTER — Ambulatory Visit (INDEPENDENT_AMBULATORY_CARE_PROVIDER_SITE_OTHER): Payer: Managed Care, Other (non HMO) | Admitting: Physician Assistant

## 2019-10-02 ENCOUNTER — Other Ambulatory Visit: Payer: Self-pay

## 2019-10-02 VITALS — BP 126/80 | HR 71 | Temp 98.2°F | Ht 60.0 in | Wt 197.4 lb

## 2019-10-02 DIAGNOSIS — M25561 Pain in right knee: Secondary | ICD-10-CM | POA: Diagnosis not present

## 2019-10-02 NOTE — Patient Instructions (Signed)
It was great to see you!  You will be contacted about your appointment ASAP.  Continue the ACE bandage for compression and ice.  Take care,  Inda Coke PA-C

## 2019-10-02 NOTE — Progress Notes (Signed)
Kylie Brennan is a 53 y.o. female here for a new problem.  I acted as a Education administrator for [employer], PA-C Kylie Pickler, LPN  History of Present Illness:   Chief Complaint  Patient presents with  . Knee Pain    HPI    Knee pain Pt c/o Rt knee pain.  She was walking to punch out at work and her right knee almost gave out on her completely. When she puts pressure on it feels like she is going to fall. Pt took Tylenol Arthritis 650 mg 2 tablets with relief. Rt knee is swollen.  She did purchase a compression sleeve of some sort to use throughout the day.  She walks a lot for work, so she was unable to go to work yesterday.  Has had some slight swelling since this time.  Denies calf pain, calf swelling, numbness or tingling in the right leg.  Currently taking Mobic 15 mg daily.  Overall symptoms are worsening with time.   Past Medical History:  Diagnosis Date  . Anemia   . Anxiety   . Blood transfusion without reported diagnosis   . Chicken pox   . COPD (chronic obstructive pulmonary disease) ([place])   . Depression   . GERD (gastroesophageal reflux disease)   . Osteoporosis   . Prediabetes   . Substance abuse ([place])      Social History   Socioeconomic History  . Marital status: Single    Spouse name: Not on file  . Number of children: Not on file  . Years of education: Not on file  . Highest education level: Not on file  Occupational History    Employer: [employer]  Social Needs  . Financial resource strain: Not on file  . Food insecurity    Worry: Not on file    Inability: Not on file  . Transportation needs    Medical: Not on file    Non-medical: Not on file  Tobacco Use  . Smoking status: Former Smoker    Packs/day: 0.25    Years: 30.00    Pack years: 7.50    Types: Cigarettes    Quit date: 06/29/2019    Years since quitting: 0.2  . Smokeless tobacco: Never Used  Substance and Sexual Activity  . Alcohol use: No    Alcohol/week: 0.0 standard drinks   . Drug use: Yes    Types: Marijuana  . Sexual activity: Not Currently    Partners: Male    Birth control/protection: Post-menopausal  Lifestyle  . Physical activity    Days per week: Not on file    Minutes per session: Not on file  . Stress: Not on file  Relationships  . Social Herbalist on phone: Not on file    Gets together: Not on file    Attends religious service: Not on file    Active member of club or organization: Not on file    Attends meetings of clubs or organizations: Not on file    Relationship status: Not on file  . Intimate partner violence    Fear of current or ex partner: Not on file    Emotionally abused: Not on file    Physically abused: Not on file    Forced sexual activity: Not on file  Other Topics Concern  . Not on file  Social History Narrative  . Not on file    Past Surgical History:  Procedure Laterality Date  . CESAREAN SECTION    .  COLPOSCOPY      Family History  Problem Relation Age of Onset  . Breast cancer Mother   . Hypertension Mother   . Stroke Mother   . Gout Mother   . Alcohol abuse Father   . Throat cancer Father   . Drug abuse Sister   . Diabetes Maternal Grandmother   . Cancer Maternal Grandfather   . Early death Maternal Grandfather   . Diabetes Paternal Grandmother   . Alcohol abuse Paternal Grandfather   . Early death Paternal Grandfather   . Colon cancer Neg Hx   . Kidney disease Neg Hx   . Hyperlipidemia Neg Hx   . Heart disease Neg Hx   . COPD Neg Hx   . Hearing loss Neg Hx     Allergies  Allergen Reactions  . Amoxicillin Rash    Current Medications:   Current Outpatient Medications:  .  acetaminophen (TYLENOL) 650 MG CR tablet, Take 650 mg by mouth every 8 (eight) hours as needed for pain., Disp: , Rfl:  .  cetirizine (ZYRTEC) 10 MG tablet, Take 10 mg by mouth., Disp: , Rfl:  .  Flaxseed, Linseed, (FLAX SEEDS PO), Take by mouth., Disp: , Rfl:  .  meloxicam (MOBIC) 15 MG tablet, Take 1 tablet  (15 mg total) by mouth daily., Disp: 30 tablet, Rfl: 2 .  methocarbamol (ROBAXIN) 500 MG tablet, Take 1 tablet (500 mg total) by mouth every 8 (eight) hours as needed for muscle spasms., Disp: 30 tablet, Rfl: 1   Review of Systems:   ROS Negative unless otherwise specified per HPI.  Vitals:   Vitals:   10/02/19 1047  BP: 126/80  Pulse: 71  Temp: 98.2 F (36.8 C)  TempSrc: Temporal  SpO2: 96%  Weight: 197 lb 6.1 oz (89.5 kg)  Height: 5' (1.524 m)     Body mass index is 38.55 kg/m.  Physical Exam:   Physical Exam Constitutional:      Appearance: She is well-developed.  HENT:     Head: Normocephalic and atraumatic.  Eyes:     Conjunctiva/sclera: Conjunctivae normal.  Neck:     Musculoskeletal: Normal range of motion and neck supple.  Pulmonary:     Effort: Pulmonary effort is normal.  Musculoskeletal: Normal range of motion.     Comments: Right Knee: Overall joint is well aligned, no significant deformity.    No significant effusion.    ROM: Decreased flexion and extension due to pain  Tenderness with palpation to patellar tendon  Antalgic gait       Skin:    General: Skin is warm and dry.  Neurological:     Mental Status: She is alert and oriented to person, place, and time.  Psychiatric:        Behavior: Behavior normal.        Thought Content: Thought content normal.        Judgment: Judgment normal.     Assessment and Plan:   Kylie Brennan was seen today for knee pain.  Diagnoses and all orders for this visit:  Recurrent pain of right knee -     Ambulatory referral to Orthopedics   I did discuss with her that I was willing to get an x-ray, however due to maybe needing an ultrasound instead I do think she would better be served at orthopedics.  I recommended that we send her for referral to see Dr. Prince Brennan.  She was agreeable to this.  She feels like her pain is managed  at goal without any prescription medications at this time.  Continue compression, ice  and medication regimen that she is already on.  . Reviewed expectations re: course of current medical issues. . Discussed self-management of symptoms. . Outlined signs and symptoms indicating need for more acute intervention. . Patient verbalized understanding and all questions were answered. . See orders for this visit as documented in the electronic medical record. . Patient received an After-Visit Summary.  CMA or LPN served as scribe during this visit. History, Physical, and Plan performed by medical provider. The above documentation has been reviewed and is accurate and complete.  Jarold MottoSamantha Thalia Turkington, PA-C

## 2019-10-05 ENCOUNTER — Other Ambulatory Visit: Payer: Self-pay

## 2019-10-05 ENCOUNTER — Encounter: Payer: Self-pay | Admitting: Family Medicine

## 2019-10-05 ENCOUNTER — Ambulatory Visit: Payer: Self-pay

## 2019-10-05 ENCOUNTER — Ambulatory Visit (INDEPENDENT_AMBULATORY_CARE_PROVIDER_SITE_OTHER): Payer: Managed Care, Other (non HMO) | Admitting: Family Medicine

## 2019-10-05 DIAGNOSIS — M25561 Pain in right knee: Secondary | ICD-10-CM

## 2019-10-05 MED ORDER — IBUPROFEN 800 MG PO TABS: 800.0000 mg | ORAL_TABLET | Freq: Three times a day (TID) | ORAL | 1 refills | Status: DC | PRN

## 2019-10-05 MED ORDER — DICLOFENAC SODIUM 1 % TD GEL: 4.0000 g | Freq: Four times a day (QID) | TRANSDERMAL | 6 refills | Status: AC | PRN

## 2019-10-05 NOTE — Progress Notes (Signed)
Office Visit Note   Patient: Kylie Brennan           Date of Birth: [birth year]           MRN: [id number] Visit Date: 10/05/2019 Requested by: Inda Coke, [place] [address] [place],  [place] [address] PCP: Briscoe Deutscher, DO  Subjective: Chief Complaint  Patient presents with  . Right Knee - Pain    Used to have issues with intermittent swelling in the knee. Had been better. Last week, the knee gave way twice while clocking out and leaving work.    HPI: She is here with right knee pain.  Symptoms started last week while leaving work.  It gave out without warning twice.  She did not fall, was able to catch herself, but she has had pain in the front of her knee since then.  She had troubles with her knee in the past but not for a while.  Denies any locking or catching symptoms.  She is not taking any medications for her pain.               ROS: No fevers or chills.  All other systems were reviewed and are negative.  Objective: Vital Signs: There were no vitals taken for this visit.  Physical Exam:  General:  Alert and oriented, in no acute distress. Pulm:  Breathing unlabored. Psy:  Normal mood, congruent affect. Skin: No rash or erythema. Right knee: No effusion, no warmth.  No pain with patella compression, no crepitus.  Very tender to palpation at the mid patellar tendon that seems to reproduce her pain.  She also has tenderness on the medial joint line but no palpable click with McMurray's.  No laxity with varus and valgus stress.   Imaging: Limited diagnostic ultrasound right knee: Patella tendon was imaged in detail.  There is no distinct tear.  There are 2 areas which are slightly hypoechoic suggesting tendinopathy.  No significant bursal swelling.  Medial meniscus was partially visualized and did not appear torn.  Assessment & Plan: 1.  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  Cannot rule out medial meniscus injury. -Trial of a patella strap brace during  activity.  Ibuprofen by mouth, Voltaren gel topically.  Straight leg raise exercises for strengthening.  If symptoms persist, then MRI scan and x-ray.     Procedures: No procedures performed  No notes on file     PMFS History: Patient Active Problem List   Diagnosis Date Noted  . Simple chronic bronchitis ([facility]) 03/23/2019  . Prediabetes   . Severe obesity (BMI >= 40) ([facility]) 03/30/2010  . Tobacco use 09/08/2008   Past Medical History:  Diagnosis Date  . Anemia   . Anxiety   . Blood transfusion without reported diagnosis   . Chicken pox   . COPD (chronic obstructive pulmonary disease) ([facility])   . Depression   . GERD (gastroesophageal reflux disease)   . Osteoporosis   . Prediabetes   . Substance abuse ([facility])     Family History  Problem Relation Age of Onset  . Breast cancer Mother   . Hypertension Mother   . Stroke Mother   . Gout Mother   . Alcohol abuse Father   . Throat cancer Father   . Drug abuse Sister   . Diabetes Maternal Grandmother   . Cancer Maternal Grandfather   . Early death Maternal Grandfather   . Diabetes Paternal Grandmother   . Alcohol abuse Paternal Grandfather   .  Early death Paternal Grandfather   . Colon cancer Neg Hx   . Kidney disease Neg Hx   . Hyperlipidemia Neg Hx   . Heart disease Neg Hx   . COPD Neg Hx   . Hearing loss Neg Hx     Past Surgical History:  Procedure Laterality Date  . CESAREAN SECTION    . COLPOSCOPY     Social History   Occupational History    Employer: [employer]  Tobacco Use  . Smoking status: Former Smoker    Packs/day: 0.25    Years: 30.00    Pack years: 7.50    Types: Cigarettes    Quit date: 06/29/2019    Years since quitting: 0.2  . Smokeless tobacco: Never Used  Substance and Sexual Activity  . Alcohol use: No    Alcohol/week: 0.0 standard drinks  . Drug use: Yes    Types: Marijuana  . Sexual activity: Not Currently    Partners: Male    Birth control/protection: Post-menopausal

## 2019-10-26 ENCOUNTER — Encounter: Payer: Self-pay | Admitting: Physician Assistant

## 2019-10-26 ENCOUNTER — Other Ambulatory Visit: Payer: Self-pay

## 2019-10-26 ENCOUNTER — Ambulatory Visit (INDEPENDENT_AMBULATORY_CARE_PROVIDER_SITE_OTHER): Payer: Managed Care, Other (non HMO) | Admitting: Physician Assistant

## 2019-10-26 VITALS — BP 130/80 | HR 75 | Temp 98.4°F | Ht 60.0 in | Wt 200.4 lb

## 2019-10-26 DIAGNOSIS — S39012S Strain of muscle, fascia and tendon of lower back, sequela: Secondary | ICD-10-CM | POA: Diagnosis not present

## 2019-10-26 DIAGNOSIS — R7303 Prediabetes: Secondary | ICD-10-CM

## 2019-10-26 DIAGNOSIS — R7989 Other specified abnormal findings of blood chemistry: Secondary | ICD-10-CM

## 2019-10-26 LAB — CBC WITH DIFFERENTIAL/PLATELET
Basophils Absolute: 0 10*3/uL (ref 0.0–0.1)
Basophils Relative: 0.6 % (ref 0.0–3.0)
Eosinophils Absolute: 0.2 10*3/uL (ref 0.0–0.7)
Eosinophils Relative: 2.7 % (ref 0.0–5.0)
HCT: 39.2 % (ref 36.0–46.0)
Hemoglobin: 12.9 g/dL (ref 12.0–15.0)
Lymphocytes Relative: 35.2 % (ref 12.0–46.0)
Lymphs Abs: 2.2 10*3/uL (ref 0.7–4.0)
MCHC: 32.8 g/dL (ref 30.0–36.0)
MCV: 89.4 fl (ref 78.0–100.0)
Monocytes Absolute: 0.4 10*3/uL (ref 0.1–1.0)
Monocytes Relative: 6.8 % (ref 3.0–12.0)
Neutro Abs: 3.4 10*3/uL (ref 1.4–7.7)
Neutrophils Relative %: 54.7 % (ref 43.0–77.0)
Platelets: 232 10*3/uL (ref 150.0–400.0)
RBC: 4.39 Mil/uL (ref 3.87–5.11)
RDW: 14.8 % (ref 11.5–15.5)
WBC: 6.2 10*3/uL (ref 4.0–10.5)

## 2019-10-26 LAB — COMPREHENSIVE METABOLIC PANEL
ALT: 9 U/L (ref 0–35)
AST: 12 U/L (ref 0–37)
Albumin: 4.1 g/dL (ref 3.5–5.2)
Alkaline Phosphatase: 98 U/L (ref 39–117)
BUN: 20 mg/dL (ref 6–23)
CO2: 27 mEq/L (ref 19–32)
Calcium: 9.6 mg/dL (ref 8.4–10.5)
Chloride: 107 mEq/L (ref 96–112)
Creatinine, Ser: 0.83 mg/dL (ref 0.40–1.20)
GFR: 87.24 mL/min (ref >60.00)
Glucose, Bld: 107 mg/dL — ABNORMAL HIGH (ref 70–99)
Potassium: 4.2 mEq/L (ref 3.5–5.1)
Sodium: 139 mEq/L (ref 135–145)
Total Bilirubin: 0.5 mg/dL (ref 0.2–1.2)
Total Protein: 6.9 g/dL (ref 6.0–8.3)

## 2019-10-26 LAB — HEMOGLOBIN A1C: Hgb A1c MFr Bld: 6.3 % (ref 4.6–6.5)

## 2019-10-26 LAB — TSH: TSH: 3.48 u[IU]/mL (ref 0.35–4.50)

## 2019-10-26 LAB — T4, FREE: Free T4: 0.83 ng/dL (ref 0.60–1.60)

## 2019-10-26 MED ORDER — MELOXICAM 15 MG PO TABS: 15.0000 mg | ORAL_TABLET | Freq: Every day | ORAL | 2 refills | Status: DC

## 2019-10-26 NOTE — Patient Instructions (Signed)
It was great to see you!  I will be in touch with your lab results soon as they return.  For your hip, please consider using the Voltaren gel to the area.  I have also refilled meloxicam.  Do not take ibuprofen and meloxicam together.  Take care,  Inda Coke PA-C

## 2019-10-26 NOTE — Progress Notes (Signed)
Kylie Brennan is a 53 y.o. female is here for Transfer of care and prediabetes.  I acted as a Education administrator for [employer], PA-C Anselmo Pickler, LPN  History of Present Illness:   Chief Complaint  Patient presents with  . Transfer of care    from Dr. Juleen China  . Prediabetes    HPI   Pt is here today to transfer care from Dr. Juleen China  Prediabetes Pt is following up today, last A1c - 6.5 was done 03/23/2019. Pt said she is back on Keto diet the past 2 weeks. She is not exercising at present.  She has never been on medications for prediabetes.  She did have gestational diabetes with her child, but was never put on any medications.  She states that she is well versed in nutrition, and actually has met with me in April of this year for nutritional counseling.  Elevated TSH Has had intermittent elevated TSH on lab checks by prior PCP.  She denies any breast changes, hair pattern changes, hot flashes, temperature intolerances, unexpected weight changes.  She has never been on medication but she is due for repeat lab check today.  Strain of lumbar region Reports that she has had issues on the right side of her hip for a few weeks, but does associate this with recently restarting her physically demanding job.  She states that her symptoms are improving as she is taking a break from work.  She has tried meloxicam, Tylenol arthritis, ibuprofen, and Robaxin.  Denies any numbness or tingling.  There are no preventive care reminders to display for this patient.  Past Medical History:  Diagnosis Date  . Anemia   . Anxiety   . Blood transfusion without reported diagnosis   . Chicken pox   . COPD (chronic obstructive pulmonary disease) ([facility])   . Depression   . GERD (gastroesophageal reflux disease)   . Osteoporosis   . Prediabetes   . Substance abuse ([facility])      Social History   Socioeconomic History  . Marital status: Single    Spouse name: Not on file  . Number of children: Not on  file  . Years of education: Not on file  . Highest education level: Not on file  Occupational History    Employer: [employer]  Social Needs  . Financial resource strain: Not on file  . Food insecurity    Worry: Not on file    Inability: Not on file  . Transportation needs    Medical: Not on file    Non-medical: Not on file  Tobacco Use  . Smoking status: Former Smoker    Packs/day: 0.25    Years: 30.00    Pack years: 7.50    Types: Cigarettes    Quit date: 06/29/2019    Years since quitting: 0.3  . Smokeless tobacco: Never Used  Substance and Sexual Activity  . Alcohol use: No    Alcohol/week: 0.0 standard drinks  . Drug use: Yes    Types: Marijuana  . Sexual activity: Not Currently    Partners: Male    Birth control/protection: Post-menopausal  Lifestyle  . Physical activity    Days per week: Not on file    Minutes per session: Not on file  . Stress: Not on file  Relationships  . Social Herbalist on phone: Not on file    Gets together: Not on file    Attends religious service: Not on file  Active member of club or organization: Not on file    Attends meetings of clubs or organizations: Not on file    Relationship status: Not on file  . Intimate partner violence    Fear of current or ex partner: Not on file    Emotionally abused: Not on file    Physically abused: Not on file    Forced sexual activity: Not on file  Other Topics Concern  . Not on file  Social History Narrative  . Not on file    Past Surgical History:  Procedure Laterality Date  . CESAREAN SECTION    . COLPOSCOPY      Family History  Problem Relation Age of Onset  . Breast cancer Mother   . Hypertension Mother   . Stroke Mother   . Gout Mother   . Alcohol abuse Father   . Throat cancer Father   . Drug abuse Sister   . Diabetes Maternal Grandmother   . Cancer Maternal Grandfather   . Early death Maternal Grandfather   . Diabetes Paternal Grandmother   . Alcohol  abuse Paternal Grandfather   . Early death Paternal Grandfather   . Colon cancer Neg Hx   . Kidney disease Neg Hx   . Hyperlipidemia Neg Hx   . Heart disease Neg Hx   . COPD Neg Hx   . Hearing loss Neg Hx     PMHx, SurgHx, SocialHx, FamHx, Medications, and Allergies were reviewed in the Visit Navigator and updated as appropriate.   Patient Active Problem List   Diagnosis Date Noted  . Simple chronic bronchitis ([facility]) 03/23/2019  . Prediabetes   . Severe obesity (BMI >= 40) ([facility]) 03/30/2010  . Tobacco use 09/08/2008    Social History   Tobacco Use  . Smoking status: Former Smoker    Packs/day: 0.25    Years: 30.00    Pack years: 7.50    Types: Cigarettes    Quit date: 06/29/2019    Years since quitting: 0.3  . Smokeless tobacco: Never Used  Substance Use Topics  . Alcohol use: No    Alcohol/week: 0.0 standard drinks  . Drug use: Yes    Types: Marijuana    Current Medications and Allergies:    Current Outpatient Medications:  .  acetaminophen (TYLENOL) 650 MG CR tablet, Take 650 mg by mouth every 8 (eight) hours as needed for pain., Disp: , Rfl:  .  cetirizine (ZYRTEC) 10 MG tablet, Take 10 mg by mouth., Disp: , Rfl:  .  diclofenac sodium (VOLTAREN) 1 % GEL, Apply 4 g topically 4 (four) times daily as needed., Disp: 500 g, Rfl: 6 .  Flaxseed, Linseed, (FLAX SEEDS PO), Take by mouth., Disp: , Rfl:  .  ibuprofen (ADVIL) 800 MG tablet, Take 1 tablet (800 mg total) by mouth every 8 (eight) hours as needed., Disp: 90 tablet, Rfl: 1 .  meloxicam (MOBIC) 15 MG tablet, Take 1 tablet (15 mg total) by mouth daily., Disp: 30 tablet, Rfl: 2 .  methocarbamol (ROBAXIN) 500 MG tablet, Take 1 tablet (500 mg total) by mouth every 8 (eight) hours as needed for muscle spasms., Disp: 30 tablet, Rfl: 1   Allergies  Allergen Reactions  . Amoxicillin Rash    Review of Systems   ROS Negative unless otherwise specified per HPI.  Vitals:   Vitals:   10/26/19 1058  BP: 130/80   Pulse: 75  Temp: 98.4 F (36.9 C)  TempSrc: Temporal  SpO2: 97%  Weight:  200 lb 6.1 oz (90.9 kg)  Height: 5' (1.524 m)     Body mass index is 39.13 kg/m.   Physical Exam:    Physical Exam Vitals signs and nursing note reviewed.  Constitutional:      General: She is not in acute distress.    Appearance: She is well-developed. She is not ill-appearing or toxic-appearing.  Cardiovascular:     Rate and Rhythm: Normal rate and regular rhythm.     Pulses: Normal pulses.     Heart sounds: Normal heart sounds, S1 normal and S2 normal.     Comments: No LE edema Pulmonary:     Effort: Pulmonary effort is normal.     Breath sounds: Normal breath sounds.  Musculoskeletal:     Comments: No decreased ROM 2/2 pain with flexion/extension, lateral side bends, or rotation. Reproducible tenderness with deep palpation to bilateral paraspinal muscles. No bony tenderness.  Tenderness with deep palpation to right SI joint area.    Skin:    General: Skin is warm and dry.  Neurological:     Mental Status: She is alert.     GCS: GCS eye subscore is 4. GCS verbal subscore is 5. GCS motor subscore is 6.  Psychiatric:        Speech: Speech normal.        Behavior: Behavior normal. Behavior is cooperative.      Assessment and Plan:    Kylie Brennan was seen today for transfer of care and prediabetes.  Diagnoses and all orders for this visit:  Elevated TSH Will repeat labs and recommend further based on results. -     TSH -     T4, free  Strain of lumbar region, sequela She does have a prescription for Voltaren gel at home, recommend that she trial this.  If her symptoms do not continue to improve with time, low threshold to get her back to see Dr. Junius Roads.  Patient verbalized understanding to plan. -     meloxicam (MOBIC) 15 MG tablet; Take 1 tablet (15 mg total) by mouth daily.  Prediabetes Has been working hard on diet recently, will recheck A1c and advise further.  Follow-up in 3 to 6 months  based on A1c result. -     Comprehensive metabolic panel -     Hemoglobin A1c -     CBC with Differential/Platelet    . Reviewed expectations re: course of current medical issues. . Discussed self-management of symptoms. . Outlined signs and symptoms indicating need for more acute intervention. . Patient verbalized understanding and all questions were answered. . See orders for this visit as documented in the electronic medical record. . Patient received an After Visit Summary.  CMA or LPN served as scribe during this visit. History, Physical, and Plan performed by medical provider. The above documentation has been reviewed and is accurate and complete.   Inda Coke, PA-C [place], [place] 10/26/2019  Follow-up: No follow-ups on file.

## 2019-10-27 ENCOUNTER — Other Ambulatory Visit: Payer: Self-pay | Admitting: Physician Assistant

## 2019-10-30 ENCOUNTER — Telehealth: Payer: Self-pay | Admitting: *Deleted

## 2019-10-30 NOTE — Telephone Encounter (Signed)
Patient removed from MMG hold.        Notes recorded by Sprague, Harley Hallmark, RN on 09/14/2019 at 8:33 AM EDT  Routing to Glorianne Manchester, RN.  ------   Notes recorded by Salvadore Dom, MD on 09/09/2019 at 9:05 AM EDT  Take out of mammogram hold  ------   Notes recorded by Burnice Logan, RN on 09/07/2019 at 2:19 PM EDT  Continued MMG hold.  ------   Notes recorded by Sprague, Harley Hallmark, RN on 09/07/2019 at 11:56 AM EDT  Routing to Glorianne Manchester, RN.  ------   Notes recorded by Salvadore Dom, MD on 09/07/2019 at 11:51 AM EDT  Keep on hold    Encounter closed.

## 2020-02-10 ENCOUNTER — Telehealth: Payer: Self-pay

## 2020-02-10 NOTE — Telephone Encounter (Signed)
Patient has been scheduled for 02/12/20 with Dr. Prince Rome for evaluation.

## 2020-02-10 NOTE — Telephone Encounter (Signed)
I left a message for the patient to call me back regarding an FMLA form and her knee pain. She will need to come in for a follow up office visit for this before this form can be completed, per Dr. Prince Rome.

## 2020-02-12 ENCOUNTER — Other Ambulatory Visit: Payer: Self-pay

## 2020-02-12 ENCOUNTER — Ambulatory Visit (INDEPENDENT_AMBULATORY_CARE_PROVIDER_SITE_OTHER): Payer: Managed Care, Other (non HMO) | Admitting: Family Medicine

## 2020-02-12 ENCOUNTER — Encounter: Payer: Self-pay | Admitting: Family Medicine

## 2020-02-12 ENCOUNTER — Ambulatory Visit: Payer: Self-pay

## 2020-02-12 DIAGNOSIS — M25562 Pain in left knee: Secondary | ICD-10-CM

## 2020-02-12 DIAGNOSIS — M25561 Pain in right knee: Secondary | ICD-10-CM | POA: Diagnosis not present

## 2020-02-12 MED ORDER — GLUCOSAMINE SULFATE 1000 MG PO CAPS: 1.0000 | ORAL_CAPSULE | Freq: Two times a day (BID) | ORAL | 3 refills | Status: AC

## 2020-02-12 NOTE — Progress Notes (Signed)
Office Visit Note   Patient: Kylie Brennan           Date of Birth: Oct 26, 1966           MRN: [id number] Visit Date: 02/12/2020 Requested by: Jarold Motto, [place] [address] [place],  [place] [address] PCP: Jarold Motto, [place]  Subjective: Chief Complaint  Patient presents with  . Right Knee - Pain    Pain mainly in the right knee, but left knee is swelling now x 3 weeks. Pain in calves at night - wakes her up.  . Left Knee - Pain    HPI: She is here with left knee pain.  Last time I saw her for her right knee and it is doing well.  Patella strap brace seems to help when she has flareups of pain.  Her left knee started hurting a couple weeks ago with no injury.  Deep aching pain with some swelling.  Her knees been bothering her intermittently for a year or 2 now.  When they flareup, it makes it difficult for her to work.  The flareups usually last a few days.  Happens every couple months.              ROS: No fevers or chills.  All other systems were reviewed and are negative.  Objective: Vital Signs: There were no vitals taken for this visit.  Physical Exam:  General:  Alert and oriented, in no acute distress. Pulm:  Breathing unlabored. Psy:  Normal mood, congruent affect. Skin: No visible rash. Left knee: Trace effusion with no warmth.  Full active range of motion, no significant patellofemoral crepitus.  She does have some pain to palpation around the medial patellofemoral joint.  No tenderness over the quadriceps or patellar tendon.  No significant joint line tenderness and no pain or click with McMurray's.  Imaging: X-rays both knees: Mild bilateral patellofemoral spurring and mild to moderate medial compartment narrowing.    Assessment & Plan: 1.  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 -Patella strap brace, straight leg raises for strengthening.  Glucosamine sulfate and turmeric as needed.  2.  Improved right knee  pain -Same as above.  FMLA paperwork filled out.    Procedures: No procedures performed  No notes on file     PMFS History: Patient Active Problem List   Diagnosis Date Noted  . Simple chronic bronchitis ([facility]) 03/23/2019  . Prediabetes   . Severe obesity (BMI >= 40) ([facility]) 03/30/2010  . Tobacco use 09/08/2008   Past Medical History:  Diagnosis Date  . Anemia   . Anxiety   . Blood transfusion without reported diagnosis   . Chicken pox   . COPD (chronic obstructive pulmonary disease) ([facility])   . Depression   . GERD (gastroesophageal reflux disease)   . Osteoporosis   . Prediabetes   . Substance abuse ([facility])     Family History  Problem Relation Age of Onset  . Breast cancer Mother   . Hypertension Mother   . Stroke Mother   . Gout Mother   . Alcohol abuse Father   . Throat cancer Father   . Drug abuse Sister   . Diabetes Maternal Grandmother   . Cancer Maternal Grandfather   . Early death Maternal Grandfather   . Diabetes Paternal Grandmother   . Alcohol abuse Paternal Grandfather   . Early death Paternal Grandfather   . Colon cancer Neg Hx   . Kidney disease Neg  Hx   . Hyperlipidemia Neg Hx   . Heart disease Neg Hx   . COPD Neg Hx   . Hearing loss Neg Hx     Past Surgical History:  Procedure Laterality Date  . CESAREAN SECTION    . COLPOSCOPY     Social History   Occupational History    Employer: [employer]  Tobacco Use  . Smoking status: Former Smoker    Packs/day: 0.25    Years: 30.00    Pack years: 7.50    Types: Cigarettes    Quit date: 06/29/2019    Years since quitting: 0.6  . Smokeless tobacco: Never Used  Substance and Sexual Activity  . Alcohol use: No    Alcohol/week: 0.0 standard drinks  . Drug use: Yes    Types: Marijuana  . Sexual activity: Not Currently    Partners: Male    Birth control/protection: Post-menopausal

## 2020-03-29 ENCOUNTER — Telehealth: Payer: Self-pay | Admitting: Obstetrics and Gynecology

## 2020-03-29 NOTE — Telephone Encounter (Signed)
Spoke with patient. Patient states she recently completed amoxicillin for sinusitis and ear infection, prescribed by Urgent Care. Patient reports external and internal vaginal itching that started on 3/30, reports light to darker colored vaginal d/c and occasional odor. Denies vaginal bleeding or redness. Has been applying baby powder and cornstarch, has also started eating yogurt, symptoms improving. Has not tried any OTC medications. Offered OV, patient declined for now. Reviewed option of OTC Monistat, if symptoms do not resolve, will need OV for further evaluation. Patient verbalizes understanding and is agreeable.   Routing to provider for final review. Patient is agreeable to disposition. Will close encounter.

## 2020-03-29 NOTE — Telephone Encounter (Signed)
Patient was taking antibiotics for sinus infection and has a yeast infection.

## 2020-03-31 NOTE — Telephone Encounter (Signed)
Left message for pt to return call to triage RN. 

## 2020-03-31 NOTE — Telephone Encounter (Signed)
I agree with the plan. If she is seeing red or [place], then I think she needs to be seen. She is PMP and there shouldn't be any bleeding.

## 2020-04-01 ENCOUNTER — Telehealth: Payer: Self-pay

## 2020-04-01 ENCOUNTER — Other Ambulatory Visit: Payer: Self-pay

## 2020-04-01 NOTE — Telephone Encounter (Signed)
Spoke with pt. Pt is PMP. Pt denies having any red or [place] vaginal bleeding.   Pt reports taking Monistat 3 for yeast sx that started on 03/29/20 and is taking last dose today. Pt states still having some itching and felt "boil" on labia yesterday while in the shower. Pt requesting OV. Offered today appt, pt declined. OV scheduled for 04/04/2020 at 3pm with Dr Oscar La. Pt agreeable and verbalized understanding.   Routing to Dr Oscar La for review.  Encounter closed.

## 2020-04-01 NOTE — Telephone Encounter (Signed)
Patient is returning call to Stephanie.

## 2020-04-01 NOTE — Telephone Encounter (Signed)
See phone encounter dated 03/29/20. Encounter closed.

## 2020-04-04 ENCOUNTER — Ambulatory Visit: Payer: 59 | Admitting: Obstetrics and Gynecology

## 2020-04-04 ENCOUNTER — Other Ambulatory Visit: Payer: Self-pay

## 2020-04-04 ENCOUNTER — Encounter: Payer: Self-pay | Admitting: Obstetrics and Gynecology

## 2020-04-04 VITALS — BP 122/74 | HR 85 | Temp 98.6°F | Ht 59.0 in | Wt 205.0 lb

## 2020-04-04 DIAGNOSIS — N760 Acute vaginitis: Secondary | ICD-10-CM

## 2020-04-04 MED ORDER — BETAMETHASONE VALERATE 0.1 % EX OINT: 1.0000 "application " | TOPICAL_OINTMENT | Freq: Two times a day (BID) | CUTANEOUS | 0 refills | Status: DC

## 2020-04-04 NOTE — Patient Instructions (Signed)
Vaginitis Vaginitis is a condition in which the vaginal tissue swells and becomes red (inflamed). This condition is most often caused by a change in the normal balance of bacteria and yeast that live in the vagina. This change causes an overgrowth of certain bacteria or yeast, which causes the inflammation. There are different types of vaginitis, but the most common types are:  Bacterial vaginosis.  Yeast infection (candidiasis).  Trichomoniasis vaginitis. This is a sexually transmitted disease (STD).  Viral vaginitis.  Atrophic vaginitis.  Allergic vaginitis. What are the causes? The cause of this condition depends on the type of vaginitis. It can be caused by:  Bacteria (bacterial vaginosis).  Yeast, which is a fungus (yeast infection).  A parasite (trichomoniasis vaginitis).  A virus (viral vaginitis).  Low hormone levels (atrophic vaginitis). Low hormone levels can occur during pregnancy, breastfeeding, or after menopause.  Irritants, such as bubble baths, scented tampons, and feminine sprays (allergic vaginitis). Other factors can change the normal balance of the yeast and bacteria that live in the vagina. These include:  Antibiotic medicines.  Poor hygiene.  Diaphragms, vaginal sponges, spermicides, birth control pills, and intrauterine devices (IUD).  Sex.  Infection.  Uncontrolled diabetes.  A weakened defense (immune) system. What increases the risk? This condition is more likely to develop in women who:  Smoke.  Use vaginal douches, scented tampons, or scented sanitary pads.  Wear tight-fitting pants.  Wear thong underwear.  Use oral birth control pills or an IUD.  Have sex without a condom.  Have multiple sex partners.  Have an STD.  Frequently use the spermicide nonoxynol-9.  Eat lots of foods high in sugar.  Have uncontrolled diabetes.  Have low estrogen levels.  Have a weakened immune system from an immune disorder or medical  treatment.  Are pregnant or breastfeeding. What are the signs or symptoms? Symptoms vary depending on the cause of the vaginitis. Common symptoms include:  Abnormal vaginal discharge. ? The discharge is white, gray, or yellow with bacterial vaginosis. ? The discharge is thick, white, and cheesy with a yeast infection. ? The discharge is frothy and yellow or greenish with trichomoniasis.  A bad vaginal smell. The smell is fishy with bacterial vaginosis.  Vaginal itching, pain, or swelling.  Sex that is painful.  Pain or burning when urinating. Sometimes there are no symptoms. How is this diagnosed? This condition is diagnosed based on your symptoms and medical history. A physical exam, including a pelvic exam, will also be done. You may also have other tests, including:  Tests to determine the pH level (acidity or alkalinity) of your vagina.  A whiff test, to assess the odor that results when a sample of your vaginal discharge is mixed with a potassium hydroxide solution.  Tests of vaginal fluid. A sample will be examined under a microscope. How is this treated? Treatment varies depending on the type of vaginitis you have. Your treatment may include:  Antibiotic creams or pills to treat bacterial vaginosis and trichomoniasis.  Antifungal medicines, such as vaginal creams or suppositories, to treat a yeast infection.  Medicine to ease discomfort if you have viral vaginitis. Your sexual partner should also be treated.  Estrogen delivered in a cream, pill, suppository, or vaginal ring to treat atrophic vaginitis. If vaginal dryness occurs, lubricants and moisturizing creams may help. You may need to avoid scented soaps, sprays, or douches.  Stopping use of a product that is causing allergic vaginitis. Then using a vaginal cream to treat the symptoms. Follow   these instructions at home: Lifestyle  Keep your genital area clean and dry. Avoid soap, and only rinse the area with  water.  Do not douche or use tampons until your health care provider says it is okay to do so. Use sanitary pads, if needed.  Do not have sex until your health care provider approves. When you can return to sex, practice safe sex and use condoms.  Wipe from front to back. This avoids the spread of bacteria from the rectum to the vagina. General instructions  Take over-the-counter and prescription medicines only as told by your health care provider.  If you were prescribed an antibiotic medicine, take or use it as told by your health care provider. Do not stop taking or using the antibiotic even if you start to feel better.  Keep all follow-up visits as told by your health care provider. This is important. How is this prevented?  Use mild, non-scented products. Do not use things that can irritate the vagina, such as fabric softeners. Avoid the following products if they are scented: ? Feminine sprays. ? Detergents. ? Tampons. ? Feminine hygiene products. ? Soaps or bubble baths.  Let air reach your genital area. ? Wear cotton underwear to reduce moisture buildup. ? Avoid wearing underwear while you sleep. ? Avoid wearing tight pants and underwear or nylons without a cotton panel. ? Avoid wearing thong underwear.  Take off any wet clothing, such as bathing suits, as soon as possible.  Practice safe sex and use condoms. Contact a health care provider if:  You have abdominal pain.  You have a fever.  You have symptoms that last for more than 2-3 days. Get help right away if:  You have a fever and your symptoms suddenly get worse. Summary  Vaginitis is a condition in which the vaginal tissue becomes inflamed.This condition is most often caused by a change in the normal balance of bacteria and yeast that live in the vagina.  Treatment varies depending on the type of vaginitis you have.  Do not douche, use tampons , or have sex until your health care provider approves. When  you can return to sex, practice safe sex and use condoms. This information is not intended to replace advice given to you by your health care provider. Make sure you discuss any questions you have with your health care provider. Document Revised: 11/22/2017 Document Reviewed: 01/15/2017 Elsevier Patient Education  2020 Elsevier Inc.  

## 2020-04-04 NOTE — Progress Notes (Signed)
GYNECOLOGY  VISIT   HPI: 54 y.o.   Single Black or African American Not Hispanic or Latino  female   [id number] with No LMP recorded. Patient is postmenopausal.   here for Patient states that she has been on antibiotics for an ear infection.She states that she feels like she has a yeast infection from the medication.  She says that she did the Monistat 3 day treatment but does not feel that healed her. Patient states that she feels like she could have a boil near her rectum.    She is still itching, not as bad after the monistat. No current vaginal discharge, no odor (did previously). Finished the [facility] night. She has a tender bump on her perineum, not sure if it draining.    GYNECOLOGIC HISTORY: No LMP recorded. Patient is postmenopausal. Contraception:PMP Menopausal hormone therapy: none        OB History    Gravida  3   Para  2   Term  2   Preterm      AB  1   Living  2     SAB      TAB      Ectopic      Multiple      Live Births  2              Patient Active Problem List   Diagnosis Date Noted  . Simple chronic bronchitis (HCC) 03/23/2019  . Prediabetes   . Severe obesity (BMI >= 40) (HCC) 03/30/2010  . Tobacco use 09/08/2008    Past Medical History:  Diagnosis Date  . Anemia   . Anxiety   . Blood transfusion without reported diagnosis   . Chicken pox   . COPD (chronic obstructive pulmonary disease) (HCC)   . Depression   . GERD (gastroesophageal reflux disease)   . Osteoporosis   . Prediabetes   . Substance abuse [facility])     Past Surgical History:  Procedure Laterality Date  . CESAREAN SECTION    . COLPOSCOPY      Current Outpatient Medications  Medication Sig Dispense Refill  . acetaminophen (TYLENOL) 650 MG CR tablet Take 650 mg by mouth every 8 (eight) hours as needed for pain.    . Ascorbic Acid (VITAMIN C WITH ROSE HIPS) 500 MG tablet Take 500 mg by mouth daily.    . cetirizine (ZYRTEC) 10 MG tablet Take 10 mg by mouth.     . Cholecalciferol (VITAMIN D3) 25 MCG (1000 UT) CAPS Take by mouth daily.    . diclofenac sodium (VOLTAREN) 1 % GEL Apply 4 g topically 4 (four) times daily as needed. 500 g 6  . ELDERBERRY PO Take by mouth.    . Flaxseed, Linseed, (FLAX SEEDS PO) Take by mouth.    . Glucosamine Sulfate 1000 MG CAPS Take 1 capsule (1,000 mg total) by mouth 2 (two) times daily. 180 capsule 3  . ibuprofen (ADVIL) 800 MG tablet Take 1 tablet (800 mg total) by mouth every 8 (eight) hours as needed. 90 tablet 1  . meloxicam (MOBIC) 15 MG tablet Take 1 tablet (15 mg total) by mouth daily. 30 tablet 2   No current facility-administered medications for this visit.     ALLERGIES: Amoxicillin  Family History  Problem Relation Age of Onset  . Breast cancer Mother   . Hypertension Mother   . Stroke Mother   . Gout Mother   . Alcohol abuse Father   . Throat cancer Father   .  Drug abuse Sister   . Diabetes Maternal Grandmother   . Cancer Maternal Grandfather   . Early death Maternal Grandfather   . Diabetes Paternal Grandmother   . Alcohol abuse Paternal Grandfather   . Early death Paternal Grandfather   . Colon cancer Neg Hx   . Kidney disease Neg Hx   . Hyperlipidemia Neg Hx   . Heart disease Neg Hx   . COPD Neg Hx   . Hearing loss Neg Hx     Social History   Socioeconomic History  . Marital status: Single    Spouse name: Not on file  . Number of children: Not on file  . Years of education: Not on file  . Highest education level: Not on file  Occupational History    Employer: [employer]  Tobacco Use  . Smoking status: Former Smoker    Packs/day: 0.25    Years: 30.00    Pack years: 7.50    Types: Cigarettes    Quit date: 06/29/2019    Years since quitting: 0.7  . Smokeless tobacco: Never Used  Substance and Sexual Activity  . Alcohol use: No    Alcohol/week: 0.0 standard drinks  . Drug use: Yes    Types: Marijuana  . Sexual activity: Not Currently    Partners: Male    Birth  control/protection: Post-menopausal  Other Topics Concern  . Not on file  Social History Narrative  . Not on file   Social Determinants of Health   Financial Resource Strain:   . Difficulty of Paying Living Expenses:   Food Insecurity:   . Worried About Charity fundraiser in the Last Year:   . Arboriculturist in the Last Year:   Transportation Needs:   . Film/video editor (Medical):   Marland Kitchen Lack of Transportation (Non-Medical):   Physical Activity:   . Days of Exercise per Week:   . Minutes of Exercise per Session:   Stress:   . Feeling of Stress :   Social Connections:   . Frequency of Communication with Friends and Family:   . Frequency of Social Gatherings with Friends and Family:   . Attends Religious Services:   . Active Member of Clubs or Organizations:   . Attends Archivist Meetings:   Marland Kitchen Marital Status:   Intimate Partner Violence:   . Fear of Current or Ex-Partner:   . Emotionally Abused:   Marland Kitchen Physically Abused:   . Sexually Abused:     Review of Systems  All other systems reviewed and are negative.   PHYSICAL EXAMINATION:    BP 122/74   Pulse 85   Temp 98.6 F (37 C)   Ht 4\' 11"  (1.499 m)   Wt 205 lb (93 kg)   SpO2 98%   BMI 41.40 kg/m     General appearance: alert, cooperative and appears stated age  Pelvic: External genitalia:  no lesions, mild irritation, some mild whitening on the lower vulva. Small cut on her perineum (from itching)              Urethra:  normal appearing urethra with no masses, tenderness or lesions              Bartholins and Skenes: normal                 Vagina: normal appearing vagina with normal color, slight increase in white creamy vaginal discharge  Cervix: no lesions               Chaperone was present for exam.  Wet prep: ?artifact vs clue, no trich, few wbc KOH: no yeast PH: 4.5   ASSESSMENT Vulvovaginitis, self treated with monistat with some improvement. Slides with BV vs artifact      PLAN Send nuswab vaginitis panel Treat with steroid ointment Further treatment depending on results of nuswab

## 2020-04-06 ENCOUNTER — Other Ambulatory Visit: Payer: Self-pay | Admitting: *Deleted

## 2020-04-06 LAB — NUSWAB VAGINITIS (VG)
Atopobium vaginae: HIGH Score — AB
Candida albicans, NAA: NEGATIVE
Candida glabrata, NAA: NEGATIVE
Trich vag by NAA: NEGATIVE

## 2020-04-06 MED ORDER — METRONIDAZOLE 500 MG PO TABS: 500.0000 mg | ORAL_TABLET | Freq: Two times a day (BID) | ORAL | 0 refills | Status: DC

## 2020-06-06 ENCOUNTER — Telehealth: Payer: Self-pay

## 2020-06-06 MED ORDER — IBUPROFEN 800 MG PO TABS: 800.0000 mg | ORAL_TABLET | Freq: Three times a day (TID) | ORAL | 1 refills | Status: AC | PRN

## 2020-06-06 NOTE — Telephone Encounter (Signed)
Sent!

## 2020-06-06 NOTE — Telephone Encounter (Signed)
Received faxed rxrf request from [facility]. Requesting rxrf on patients behalf for ibuprofen 800mg . Please advise. Thanks.

## 2020-06-06 NOTE — Addendum Note (Signed)
Addended by: Lillia Carmel on: 06/06/2020 01:08 PM   Modules accepted: Orders

## 2020-06-14 ENCOUNTER — Other Ambulatory Visit: Payer: Self-pay

## 2020-06-14 NOTE — Progress Notes (Signed)
54 y.o. [id number] Single Black or African American Not Hispanic or Latino female here for annual exam.  Patient states that she has trouble wiping front to back. She says that she had been using the Always wipes but is not sure if they are safe.   No bleeding. Occasional urge incontinence, small amounts. So far tolerable. Drinks coffee 1 x a day. No change, tolerable.   Sexually active, partner travels, last together in 2/21. They are monogamous, but she would like STD testing to be safe.     HgbA1C 6.3 in 11/20  No LMP recorded. Patient is postmenopausal.          Sexually active: No.  The current method of family planning is post menopausal status.    Exercising: No.  The patient does not participate in regular exercise at present. Smoker:  No, quit last year.  Health Maintenance: Pap: 06/15/19 Neg, HR HPV negative. 04-18-16 Neg:Neg HR HPV History of abnormal Pap:Yes, Hx of colpo 1993 MMG:  09/07/19 fibrocystic changes with calcifications of the left breast.  BMD:   Never Colonoscopy: 07/07/18 normal  TDaP:  05/29/18 Gardasil: n/a   reports that she quit smoking about a year ago. Her smoking use included cigarettes. She has a 7.50 pack-year smoking history. She has never used smokeless tobacco. She reports current drug use. Drug: Marijuana. She reports that she does not drink alcohol. She works for [employer], 48 hours a week. 2 grown kids, 2 grand sons 85 and 64 months.   Past Medical History:  Diagnosis Date  . Anemia   . Anxiety   . Blood transfusion without reported diagnosis   . Chicken pox   . COPD (chronic obstructive pulmonary disease) ([place])   . Depression   . GERD (gastroesophageal reflux disease)   . Osteoporosis   . Prediabetes   . Substance abuse [facility])     Past Surgical History:  Procedure Laterality Date  . CESAREAN SECTION    . COLPOSCOPY      Current Outpatient Medications  Medication Sig Dispense Refill  . acetaminophen (TYLENOL) 650 MG CR tablet  Take 650 mg by mouth every 8 (eight) hours as needed for pain.    . Ascorbic Acid (VITAMIN C WITH ROSE HIPS) 500 MG tablet Take 500 mg by mouth daily.    . betamethasone valerate ointment (VALISONE) 0.1 % Apply 1 application topically 2 (two) times daily. For 1-2 weeks as needed 15 g 0  . cetirizine (ZYRTEC) 10 MG tablet Take 10 mg by mouth.    . Cholecalciferol (VITAMIN D3) 25 MCG (1000 UT) CAPS Take by mouth daily.    . diclofenac sodium (VOLTAREN) 1 % GEL Apply 4 g topically 4 (four) times daily as needed. 500 g 6  . ELDERBERRY PO Take by mouth.    . Flaxseed, Linseed, (FLAX SEEDS PO) Take by mouth.    . Glucosamine Sulfate 1000 MG CAPS Take 1 capsule (1,000 mg total) by mouth 2 (two) times daily. 180 capsule 3  . ibuprofen (ADVIL) 800 MG tablet Take 1 tablet (800 mg total) by mouth every 8 (eight) hours as needed. 90 tablet 1  . meloxicam (MOBIC) 15 MG tablet Take 1 tablet (15 mg total) by mouth daily. 30 tablet 2   No current facility-administered medications for this visit.    Family History  Problem Relation Age of Onset  . Breast cancer Mother   . Hypertension Mother   . Stroke Mother   . Gout Mother   .  Alcohol abuse Father   . Throat cancer Father   . Drug abuse Sister   . Diabetes Maternal Grandmother   . Cancer Maternal Grandfather   . Early death Maternal Grandfather   . Diabetes Paternal Grandmother   . Alcohol abuse Paternal Grandfather   . Early death Paternal Grandfather   . Colon cancer Neg Hx   . Kidney disease Neg Hx   . Hyperlipidemia Neg Hx   . Heart disease Neg Hx   . COPD Neg Hx   . Hearing loss Neg Hx     Review of Systems  All other systems reviewed and are negative.   Exam:   BP 122/68   Pulse 78   Temp 98.2 F (36.8 C)   Ht 5' (1.524 m)   Wt 204 lb (92.5 kg)   SpO2 98%   BMI 39.84 kg/m   Weight change: @WEIGHTCHANGE @ Height:   Height: 5' (152.4 cm)  Ht Readings from Last 3 Encounters:  06/15/20 5' (1.524 m)  04/04/20 4\' 11"  (1.499 m)   10/26/19 5' (1.524 m)    General appearance: alert, cooperative and appears stated age Head: Normocephalic, without obvious abnormality, atraumatic Neck: no adenopathy, supple, symmetrical, trachea midline and thyroid normal to inspection and palpation Lungs: clear to auscultation bilaterally Cardiovascular: regular rate and rhythm Breasts: normal appearance, no masses or tenderness Abdomen: soft, non-tender; non distended,  no masses,  no organomegaly Extremities: extremities normal, atraumatic, no cyanosis or edema Skin: Skin color, texture, turgor normal. No rashes or lesions Lymph nodes: Cervical, supraclavicular, and axillary nodes normal. No abnormal inguinal nodes palpated Neurologic: Grossly normal   Pelvic: External genitalia:  no lesions              Urethra:  normal appearing urethra with no masses, tenderness or lesions              Bartholins and Skenes: normal                 Vagina: mildly atrophic appearing vagina with normal color and discharge, no lesions              Cervix: no lesions               Bimanual Exam:  Uterus:  normal size, contour, position, consistency, mobility, non-tender              Adnexa: no mass, fullness, tenderness               Rectovaginal: Confirms               Anus:  normal sphincter tone, no lesions  13/02/20 chaperoned for the exam.  A:  Well Woman with normal exam  PMP, no bleeding  Mild OAB symptoms, tolerable. Recommended she try and wean off caffeine  P:   No pap this year  Mammogram in the fall  Colonoscopy UTD  Discussed breast self exam  Discussed calcium and vit D intake  Labs with primary

## 2020-06-15 ENCOUNTER — Encounter: Payer: Self-pay | Admitting: Obstetrics and Gynecology

## 2020-06-15 ENCOUNTER — Ambulatory Visit: Payer: 59 | Admitting: Obstetrics and Gynecology

## 2020-06-15 VITALS — BP 122/68 | HR 78 | Temp 98.2°F | Ht 60.0 in | Wt 204.0 lb

## 2020-06-15 DIAGNOSIS — Z113 Encounter for screening for infections with a predominantly sexual mode of transmission: Secondary | ICD-10-CM

## 2020-06-15 DIAGNOSIS — Z01419 Encounter for gynecological examination (general) (routine) without abnormal findings: Secondary | ICD-10-CM | POA: Diagnosis not present

## 2020-06-15 DIAGNOSIS — Z6839 Body mass index (BMI) 39.0-39.9, adult: Secondary | ICD-10-CM | POA: Diagnosis not present

## 2020-06-15 NOTE — Patient Instructions (Signed)

## 2020-06-16 LAB — CHLAMYDIA/GONOCOCCUS/TRICHOMONAS, NAA
Chlamydia by NAA: NEGATIVE
Gonococcus by NAA: NEGATIVE
Trich vag by NAA: NEGATIVE

## 2020-06-16 LAB — HIV ANTIBODY (ROUTINE TESTING W REFLEX): HIV Screen 4th Generation wRfx: NONREACTIVE

## 2020-06-16 LAB — RPR: RPR Ser Ql: NONREACTIVE

## 2020-07-05 ENCOUNTER — Other Ambulatory Visit: Payer: Self-pay | Admitting: Physician Assistant

## 2020-07-05 DIAGNOSIS — S39012S Strain of muscle, fascia and tendon of lower back, sequela: Secondary | ICD-10-CM

## 2020-07-19 ENCOUNTER — Encounter: Payer: Self-pay | Admitting: Family Medicine

## 2020-07-19 ENCOUNTER — Ambulatory Visit: Payer: Managed Care, Other (non HMO) | Admitting: Family Medicine

## 2020-07-19 ENCOUNTER — Other Ambulatory Visit: Payer: Self-pay

## 2020-07-19 ENCOUNTER — Ambulatory Visit (INDEPENDENT_AMBULATORY_CARE_PROVIDER_SITE_OTHER): Payer: 59

## 2020-07-19 DIAGNOSIS — M79642 Pain in left hand: Secondary | ICD-10-CM

## 2020-07-19 MED ORDER — CELECOXIB 200 MG PO CAPS: 200.0000 mg | ORAL_CAPSULE | Freq: Two times a day (BID) | ORAL | 6 refills | Status: AC | PRN

## 2020-07-19 NOTE — Progress Notes (Signed)
Office Visit Note   Patient: Kylie Brennan           Date of Birth: 1966/10/04           MRN: [id number] Visit Date: 07/19/2020 Requested by: Jarold Motto, [place] [address] [place],  [place] [address] PCP: Jarold Motto, [place]  Subjective: Chief Complaint  Patient presents with  . Left Hand - Pain    Pain left thumb, into the wrist - flared up last week. First had this issue 2 years ago - she thinks her work irritates her hand. Left-hand dominant. Been getting deep-tissue massages each time it flares. Not helping this time.    HPI: She is here with left hand pain.  She is left-hand dominant.  She has had intermittent pain for about 2 years.  She works for a [employer] doing repetitive work.  In the past couple months it has flared up significantly.  There is a chiropractor who goes to her job, she has been under treatment for her hand getting massage type therapy.  The treatments helped temporarily but the pain is not going away.  Occasional numbness in the thumb, index and third fingers of both hands.  She has noticed some weakness in her left hand.  Mostly she has pain in the palm area.  Recently her chiropractor noted that she had "heat" near the left elbow.  She has been using topical diclofenac along with meloxicam or ibuprofen with some relief.               ROS:   All other systems were reviewed and are negative.  Objective: Vital Signs: There were no vitals taken for this visit.  Physical Exam:  General:  Alert and oriented, in no acute distress. Pulm:  Breathing unlabored. Psy:  Normal mood, congruent affect. Skin: There is no erythema today. Left hand: There is slight warmth in the left forearm muscle belly compared to the right, no tenderness.  Her hand has no swelling, no synovitis in the joints.  Her thumb is tender near the IP joint and the MTP joint.  Lourena Simmonds test is negative, Tinel's at the carpal tunnel is positive but Phalen's test is negative.   No thenar atrophy.   Imaging: XR Hand Complete Left  Result Date: 07/19/2020 Three-view x-rays of left hand reveal mild to moderate degenerative change at the thumb and index finger MCP joints.  The wrist looks normal overall.  Possible subchondral cystic change at the third finger PIP joint.   Assessment & Plan: 1.  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  Cannot rule out rheumatologic disease.  She has some symptoms of carpal tunnel syndrome as well. -Trial of Celebrex, glucosamine and turmeric.  Voltaren gel as needed. -  carpal tunnel night splint. -If pain persist, consider rheumatologic labs and possibly hand therapy referral. -Out of work until Monday.     Procedures: No procedures performed  No notes on file     PMFS History: Patient Active Problem List   Diagnosis Date Noted  . Simple chronic bronchitis (HCC) 03/23/2019  . Prediabetes   . Severe obesity (BMI >= 40) (HCC) 03/30/2010  . Tobacco use 09/08/2008   Past Medical History:  Diagnosis Date  . Anemia   . Anxiety   . Blood transfusion without reported diagnosis   . Chicken pox   . COPD (chronic obstructive pulmonary disease) (HCC)   . Depression   . GERD (gastroesophageal reflux disease)   .  Osteoporosis   . Prediabetes   . Substance abuse (HCC)     Family History  Problem Relation Age of Onset  . Breast cancer Mother   . Hypertension Mother   . Stroke Mother   . Gout Mother   . Alcohol abuse Father   . Throat cancer Father   . Drug abuse Sister   . Diabetes Maternal Grandmother   . Cancer Maternal Grandfather   . Early death Maternal Grandfather   . Diabetes Paternal Grandmother   . Alcohol abuse Paternal Grandfather   . Early death Paternal Grandfather   . Colon cancer Neg Hx   . Kidney disease Neg Hx   . Hyperlipidemia Neg Hx   . Heart disease Neg Hx   . COPD Neg Hx   . Hearing loss Neg Hx     Past Surgical History:  Procedure Laterality Date  . CESAREAN SECTION    .  COLPOSCOPY     Social History   Occupational History    Employer: [employer]  Tobacco Use  . Smoking status: Former Smoker    Packs/day: 0.25    Years: 30.00    Pack years: 7.50    Types: Cigarettes    Quit date: 06/29/2019    Years since quitting: 1.0  . Smokeless tobacco: Never Used  Vaping Use  . Vaping Use: Never used  Substance and Sexual Activity  . Alcohol use: No    Alcohol/week: 0.0 standard drinks  . Drug use: Yes    Types: Marijuana  . Sexual activity: Not Currently    Partners: Male    Birth control/protection: Post-menopausal

## 2020-07-19 NOTE — Patient Instructions (Signed)
   Glucosamine sulfate:  1,000 mg twice daily  Turmeric:  500 mg twice daily  Carpal tunnel brace while sleeping  Celebrex as needed for pain/inflammation  If not improving, we might order some blood tests, and possibly referral to hand therapy.

## 2020-09-23 ENCOUNTER — Telehealth: Payer: Self-pay

## 2020-09-23 NOTE — Telephone Encounter (Signed)
This medication has never been prescribed by [facility]. Pt will need to schedule an appt.

## 2020-09-23 NOTE — Telephone Encounter (Signed)
..   LAST APPOINTMENT DATE: 07/05/2020   NEXT APPOINTMENT DATE:@Visit  date not found  MEDICATION:methocarbamol (ROBAXIN) 500 MG tablet   PHARMACY:

## 2020-10-03 ENCOUNTER — Other Ambulatory Visit: Payer: Self-pay | Admitting: Obstetrics and Gynecology

## 2020-10-03 DIAGNOSIS — Z1231 Encounter for screening mammogram for malignant neoplasm of breast: Secondary | ICD-10-CM

## 2020-10-27 ENCOUNTER — Ambulatory Visit
Admission: RE | Admit: 2020-10-27 | Discharge: 2020-10-27 | Disposition: A | Discharge status: Home / Self Care | Payer: 59 | Source: Ambulatory Visit | Attending: Obstetrics and Gynecology | Admitting: Obstetrics and Gynecology

## 2020-10-27 ENCOUNTER — Other Ambulatory Visit: Payer: Self-pay

## 2020-10-27 DIAGNOSIS — Z1231 Encounter for screening mammogram for malignant neoplasm of breast: Secondary | ICD-10-CM

## 2020-11-01 ENCOUNTER — Other Ambulatory Visit: Payer: Self-pay | Admitting: Obstetrics and Gynecology

## 2020-11-01 DIAGNOSIS — R928 Other abnormal and inconclusive findings on diagnostic imaging of breast: Secondary | ICD-10-CM

## 2020-11-11 ENCOUNTER — Ambulatory Visit
Admission: RE | Admit: 2020-11-11 | Discharge: 2020-11-11 | Disposition: A | Discharge status: Home / Self Care | Payer: 59 | Source: Ambulatory Visit | Attending: Obstetrics and Gynecology | Admitting: Obstetrics and Gynecology

## 2020-11-11 ENCOUNTER — Other Ambulatory Visit: Payer: Self-pay

## 2020-11-11 DIAGNOSIS — R928 Other abnormal and inconclusive findings on diagnostic imaging of breast: Secondary | ICD-10-CM

## 2021-04-10 ENCOUNTER — Telehealth: Payer: Self-pay

## 2021-04-10 NOTE — Telephone Encounter (Signed)
LVM for the patient to make appt with Lelon Mast if she wants her Disability paperwork filled out since she hasn't been seen since 2020.

## 2021-05-05 IMAGING — MG DIGITAL SCREENING BILAT W/ TOMO W/ CAD
8 series · 8 of 24 positions shown · non-contrast
Comparison: Previous exam(s).

CLINICAL DATA: Screening.

EXAM:
DIGITAL SCREENING BILATERAL MAMMOGRAM WITH TOMO AND CAD

[R MLO synth-2D]
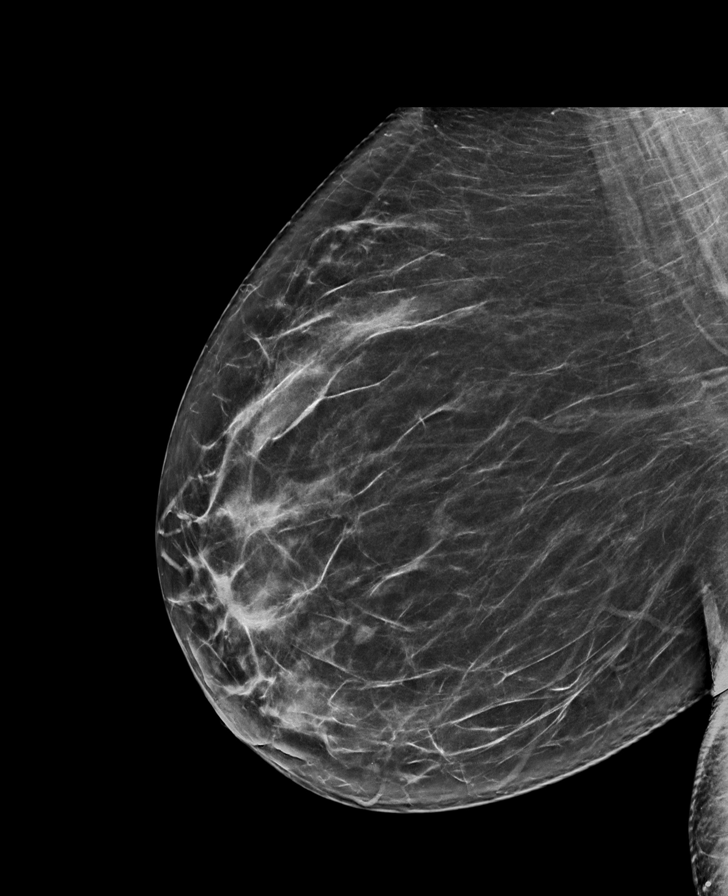

[L MLO synth-2D]
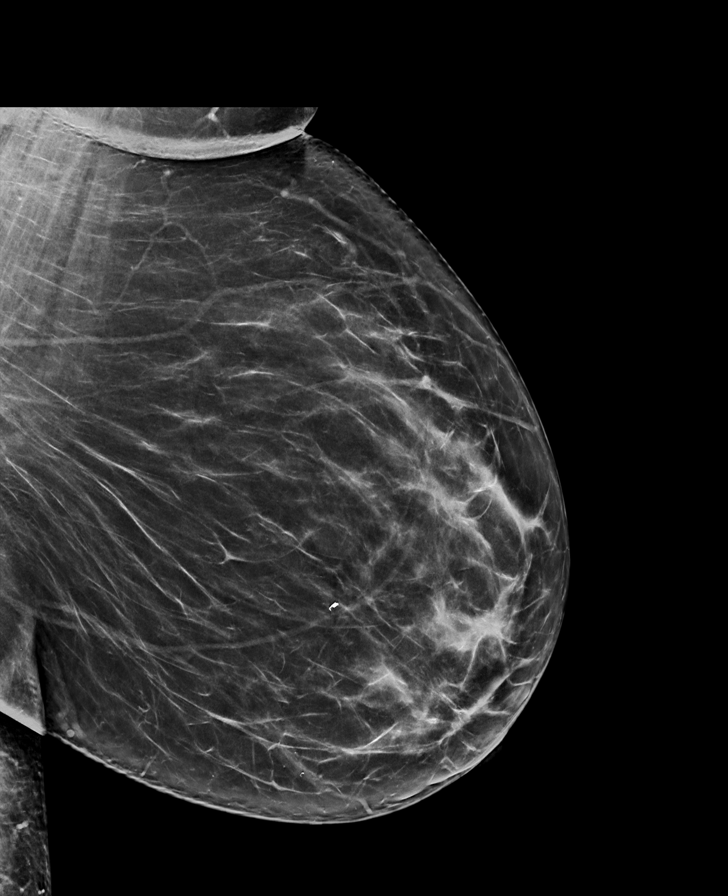

[L CC synth-2D]
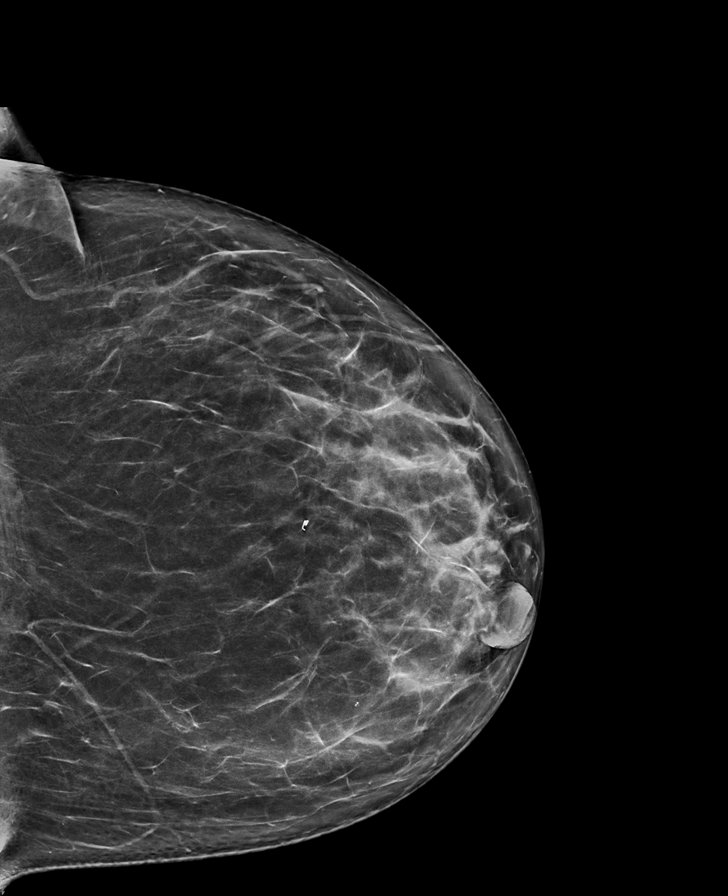

[R CC synth-2D]
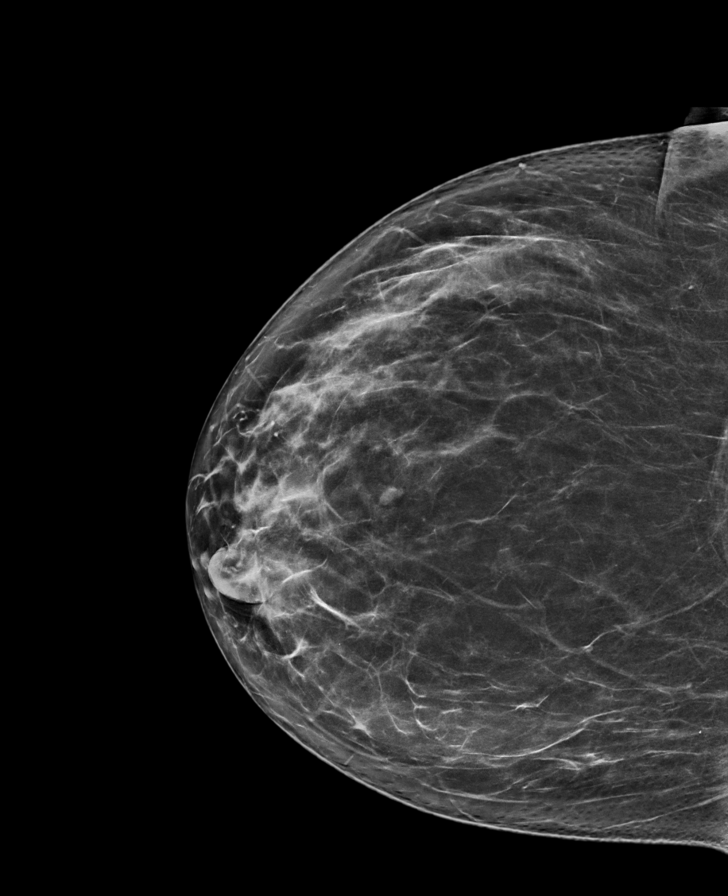

[L MLO tomo · tomo slice 43/84.0]
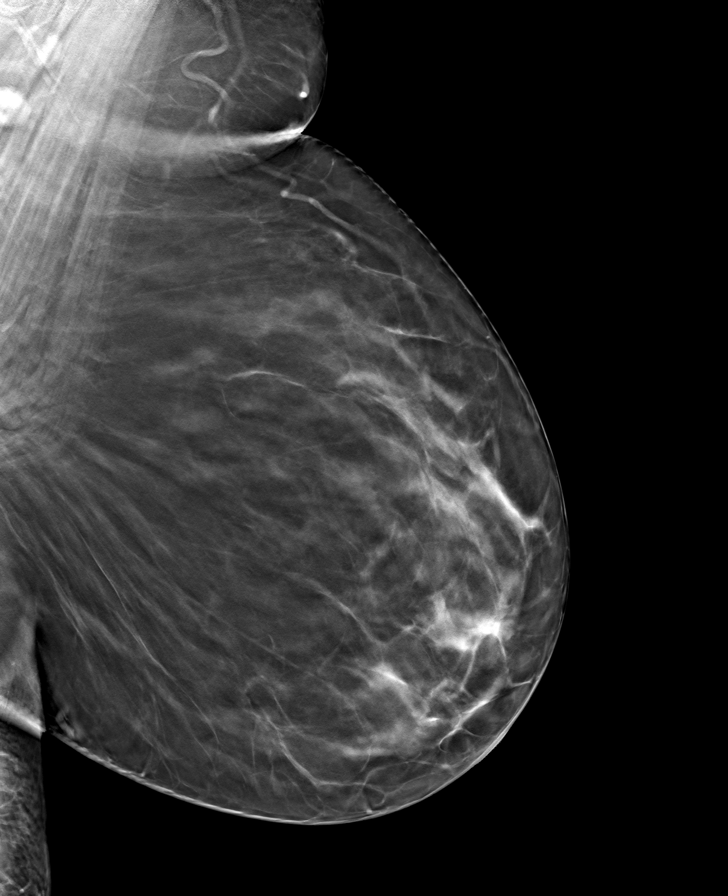

[L CC tomo · tomo slice 43/85.0]
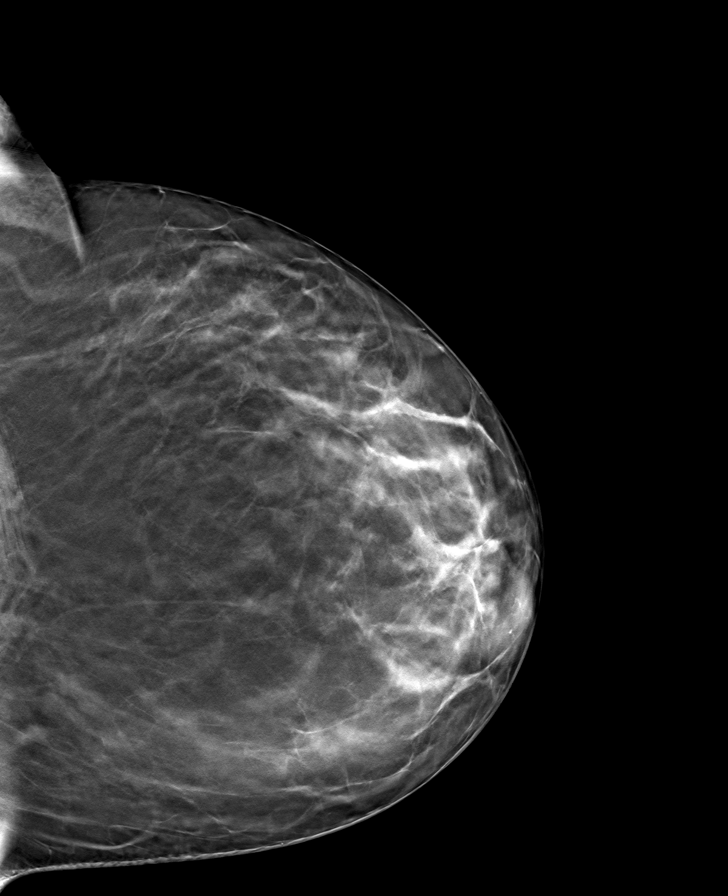

[R MLO tomo · tomo slice 41/81.0]
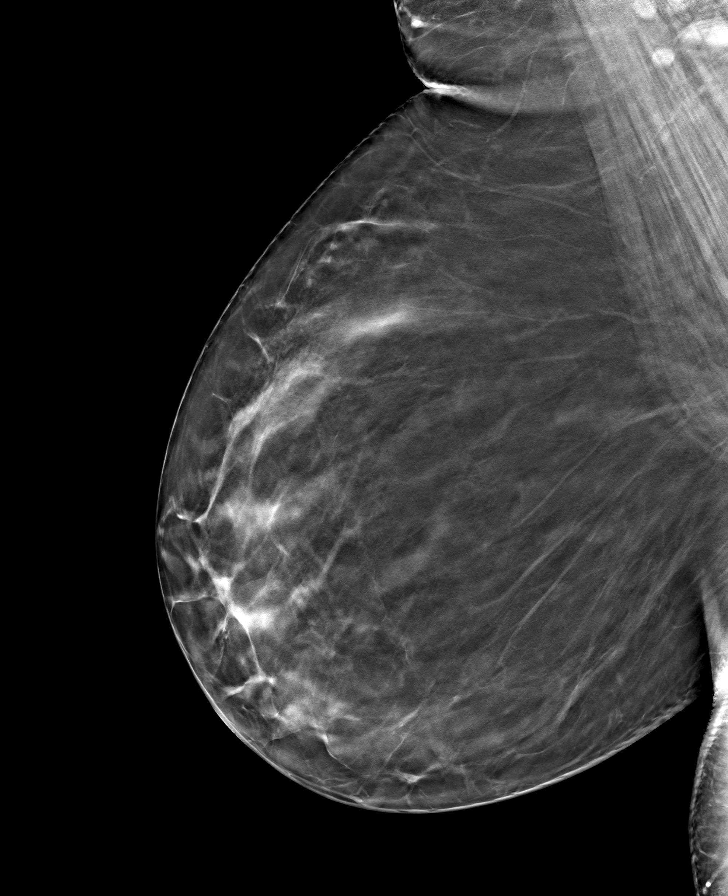

[R CC tomo · tomo slice 40/79.0]
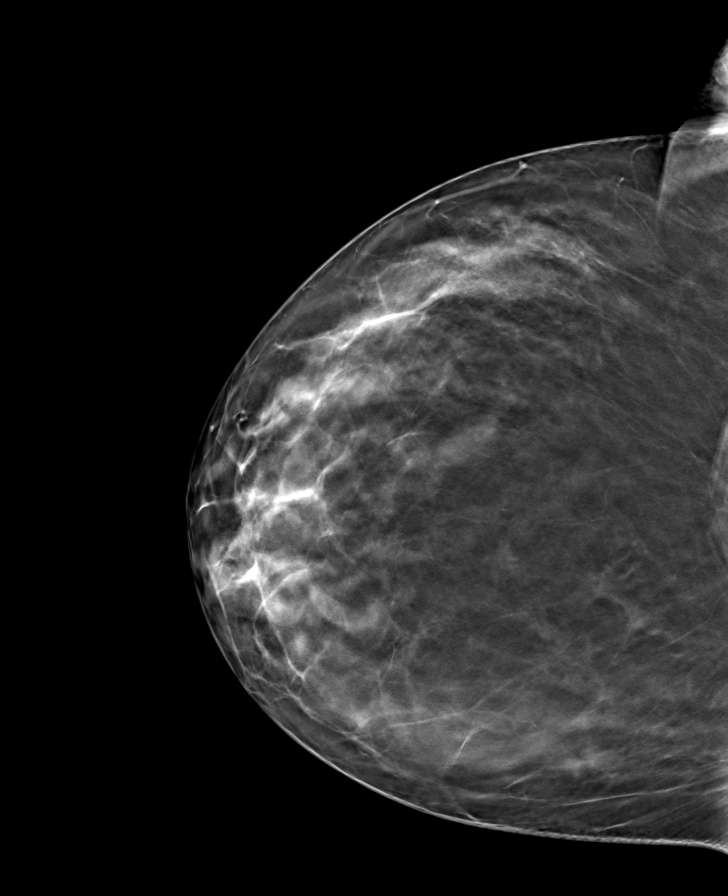

[8 of 24 positions shown; findings below may reference images not displayed]

ACR Breast Density Category b: There are scattered areas of
fibroglandular density.
FINDINGS: In the right breast, a possible mass warrants further evaluation. In
the left breast, no findings suspicious for malignancy. Images were
processed with CAD.
IMPRESSION: Further evaluation is suggested for a possible mass in the right
breast.

RECOMMENDATION:
Diagnostic mammogram and possibly ultrasound of the right breast.
(Code:E5-X-CC4)

The patient will be contacted regarding the findings, and additional
imaging will be scheduled.

BI-RADS CATEGORY  0: Incomplete. Need additional imaging evaluation
and/or prior mammograms for comparison.

## 2021-06-13 NOTE — Progress Notes (Deleted)
55 y.o. [id number] Single Black or African American Not Hispanic or Latino female here for annual exam.      No LMP recorded. Patient is postmenopausal.          Sexually active: {yes no:314532}  The current method of family planning is {contraception:315051}.    Exercising: {yes no:314532}  {types:19826} Smoker:  {YES NO:22349}  Health Maintenance: Pap:  06/15/19 Neg, HR HPV negative. 04-18-16 Neg:Neg HR HPV History of abnormal Pap:  yes colpo in 1993  MMG:  10/2020 density B bi-rads 3 probably benign. See epic BMD:   never  Colonoscopy:  07/07/18 normal  TDaP:  05/29/18  Gardasil: NA   reports that she quit smoking about 1 years ago. Her smoking use included cigarettes. She has a 7.50 pack-year smoking history. She has never used smokeless tobacco. She reports current drug use. Drug: Marijuana. She reports that she does not drink alcohol.  Past Medical History:  Diagnosis Date   Anemia    Anxiety    Blood transfusion without reported diagnosis    Chicken pox    COPD (chronic obstructive pulmonary disease) (HCC)    Depression    GERD (gastroesophageal reflux disease)    Osteoporosis    Prediabetes    Substance abuse (HCC)     Past Surgical History:  Procedure Laterality Date   CESAREAN SECTION     COLPOSCOPY      Current Outpatient Medications  Medication Sig Dispense Refill   acetaminophen (TYLENOL) 650 MG CR tablet Take 650 mg by mouth every 8 (eight) hours as needed for pain.     Ascorbic Acid (VITAMIN C WITH ROSE HIPS) 500 MG tablet Take 500 mg by mouth daily.     betamethasone valerate ointment (VALISONE) 0.1 % Apply 1 application topically 2 (two) times daily. For 1-2 weeks as needed 15 g 0   celecoxib (CELEBREX) 200 MG capsule Take 1 capsule (200 mg total) by mouth 2 (two) times daily as needed. 60 capsule 6   cetirizine (ZYRTEC) 10 MG tablet Take 10 mg by mouth.     Cholecalciferol (VITAMIN D3) 25 MCG (1000 UT) CAPS Take by mouth daily.     diclofenac sodium  (VOLTAREN) 1 % GEL Apply 4 g topically 4 (four) times daily as needed. 500 g 6   ELDERBERRY PO Take by mouth.     Flaxseed, Linseed, (FLAX SEEDS PO) Take by mouth.     Glucosamine Sulfate 1000 MG CAPS Take 1 capsule (1,000 mg total) by mouth 2 (two) times daily. 180 capsule 3   ibuprofen (ADVIL) 800 MG tablet Take 1 tablet (800 mg total) by mouth every 8 (eight) hours as needed. 90 tablet 1   meloxicam (MOBIC) 15 MG tablet TAKE 1 TABLET(15 MG) BY MOUTH DAILY 30 tablet 2   No current facility-administered medications for this visit.    Family History  Problem Relation Age of Onset   Breast cancer Mother    Hypertension Mother    Stroke Mother    Gout Mother    Alcohol abuse Father    Throat cancer Father    Drug abuse Sister    Diabetes Maternal Grandmother    Cancer Maternal Grandfather    Early death Maternal Grandfather    Diabetes Paternal Grandmother    Alcohol abuse Paternal Grandfather    Early death Paternal Grandfather    Colon cancer Neg Hx    Kidney disease Neg Hx    Hyperlipidemia Neg Hx    Heart disease  Neg Hx    COPD Neg Hx    Hearing loss Neg Hx     Review of Systems  Exam:   There were no vitals taken for this visit.  Weight change: @WEIGHTCHANGE @ Height:      Ht Readings from Last 3 Encounters:  06/15/20 5' (1.524 m)  04/04/20 4\' 11"  (1.499 m)  10/26/19 5' (1.524 m)    General appearance: alert, cooperative and appears stated age Head: Normocephalic, without obvious abnormality, atraumatic Neck: no adenopathy, supple, symmetrical, trachea midline and thyroid {CHL AMB PHY EX THYROID NORM DEFAULT:[phone]} Lungs: clear to auscultation bilaterally Cardiovascular: regular rate and rhythm Breasts: {Exam; breast:13139} Abdomen: soft, non-tender; non distended,  no masses,  no organomegaly Extremities: extremities normal, atraumatic, no cyanosis or edema Skin: Skin color, texture, turgor normal. No rashes or lesions Lymph nodes: Cervical,  supraclavicular, and axillary nodes normal. No abnormal inguinal nodes palpated Neurologic: Grossly normal   Pelvic: External genitalia:  no lesions              Urethra:  normal appearing urethra with no masses, tenderness or lesions              Bartholins and Skenes: normal                 Vagina: normal appearing vagina with normal color and discharge, no lesions              Cervix: {CHL AMB PHY EX CERVIX NORM DEFAULT:[phone]}               Bimanual Exam:  Uterus:  {CHL AMB PHY EX UTERUS NORM DEFAULT:[phone]}              Adnexa: {CHL AMB PHY EX ADNEXA NO MASS DEFAULT:[phone]}               Rectovaginal: Confirms               Anus:  normal sphincter tone, no lesions chaperoned for the exam.  A:  Well Woman with normal exam  P:

## 2021-06-19 ENCOUNTER — Ambulatory Visit: Payer: 59 | Admitting: Obstetrics and Gynecology

## 2021-06-19 DIAGNOSIS — Z0289 Encounter for other administrative examinations: Secondary | ICD-10-CM

## 2022-06-07 NOTE — Progress Notes (Deleted)
56 y.o. [id number] Single Black or African American Not Hispanic or Latino female here for annual exam.      No LMP recorded. Patient is postmenopausal.          Sexually active: {yes no:314532}  The current method of family planning is post menopausal status.    Exercising: {yes no:314532}  {types:19826} Smoker:  no  Health Maintenance: Pap:   06/15/19 Neg, HR HPV negative. 04-18-16 Neg:Neg HR HPV History of abnormal Pap:  Yes, Hx of colpo 1993 MMG:  10/28/20 density B Bi-rads incomplete had [place] 11.19/21 bi-rads 3 probable benign  BMD:   none  Colonoscopy: 07/07/18 normal  TDaP:  05/29/18  Gardasil: n/a   reports that she quit smoking about 2 years ago. Her smoking use included cigarettes. She has a 7.50 pack-year smoking history. She has never used smokeless tobacco. She reports current drug use. Drug: Marijuana. She reports that she does not drink alcohol.  Past Medical History:  Diagnosis Date   Anemia    Anxiety    Blood transfusion without reported diagnosis    Chicken pox    COPD (chronic obstructive pulmonary disease) (HCC)    Depression    GERD (gastroesophageal reflux disease)    Osteoporosis    Prediabetes    Substance abuse (HCC)     Past Surgical History:  Procedure Laterality Date   CESAREAN SECTION     COLPOSCOPY      Current Outpatient Medications  Medication Sig Dispense Refill   acetaminophen (TYLENOL) 650 MG CR tablet Take 650 mg by mouth every 8 (eight) hours as needed for pain.     Ascorbic Acid (VITAMIN C WITH ROSE HIPS) 500 MG tablet Take 500 mg by mouth daily.     betamethasone valerate ointment (VALISONE) 0.1 % Apply 1 application topically 2 (two) times daily. For 1-2 weeks as needed 15 g 0   celecoxib (CELEBREX) 200 MG capsule Take 1 capsule (200 mg total) by mouth 2 (two) times daily as needed. 60 capsule 6   cetirizine (ZYRTEC) 10 MG tablet Take 10 mg by mouth.     Cholecalciferol (VITAMIN D3) 25 MCG (1000 UT) CAPS Take by mouth daily.     diclofenac  sodium (VOLTAREN) 1 % GEL Apply 4 g topically 4 (four) times daily as needed. 500 g 6   ELDERBERRY PO Take by mouth.     Flaxseed, Linseed, (FLAX SEEDS PO) Take by mouth.     Glucosamine Sulfate 1000 MG CAPS Take 1 capsule (1,000 mg total) by mouth 2 (two) times daily. 180 capsule 3   ibuprofen (ADVIL) 800 MG tablet Take 1 tablet (800 mg total) by mouth every 8 (eight) hours as needed. 90 tablet 1   meloxicam (MOBIC) 15 MG tablet TAKE 1 TABLET(15 MG) BY MOUTH DAILY 30 tablet 2   No current facility-administered medications for this visit.    Family History  Problem Relation Age of Onset   Breast cancer Mother    Hypertension Mother    Stroke Mother    Gout Mother    Alcohol abuse Father    Throat cancer Father    Drug abuse Sister    Diabetes Maternal Grandmother    Cancer Maternal Grandfather    Early death Maternal Grandfather    Diabetes Paternal Grandmother    Alcohol abuse Paternal Grandfather    Early death Paternal Grandfather    Colon cancer Neg Hx    Kidney disease Neg Hx    Hyperlipidemia Neg Hx  Heart disease Neg Hx    COPD Neg Hx    Hearing loss Neg Hx     Review of Systems  Exam:   There were no vitals taken for this visit.  Weight change: @WEIGHTCHANGE @ Height:      Ht Readings from Last 3 Encounters:  06/15/20 5' (1.524 m)  04/04/20 4\' 11"  (1.499 m)  10/26/19 5' (1.524 m)    General appearance: alert, cooperative and appears stated age Head: Normocephalic, without obvious abnormality, atraumatic Neck: no adenopathy, supple, symmetrical, trachea midline and thyroid normal to inspection and palpation Lungs: clear to auscultation bilaterally Cardiovascular: regular rate and rhythm Breasts: {Exam; breast:13139::"normal appearance, no masses or tenderness"} Abdomen: soft, non-tender; non distended,  no masses,  no organomegaly Extremities: extremities normal, atraumatic, no cyanosis or edema Skin: Skin color, texture, turgor normal. No rashes or  lesions Lymph nodes: Cervical, supraclavicular, and axillary nodes normal. No abnormal inguinal nodes palpated Neurologic: Grossly normal   Pelvic: External genitalia:  no lesions              Urethra:  normal appearing urethra with no masses, tenderness or lesions              Bartholins and Skenes: normal                 Vagina: normal appearing vagina with normal color and discharge, no lesions              Cervix: {CHL AMB PHY EX CERVIX NORM DEFAULT:[phone]::"no lesions"}               Bimanual Exam:  Uterus:  {CHL AMB PHY EX UTERUS NORM [phone]::"normal size, contour, position, consistency, mobility, non-tender"}              Adnexa: {CHL AMB PHY EX ADNEXA NO MASS DEFAULT:[phone]::"no mass, fullness, tenderness"}               Rectovaginal: Confirms               Anus:  normal sphincter tone, no lesions  *** chaperoned for the exam.  A:  Well Woman with normal exam  P:

## 2022-06-20 ENCOUNTER — Ambulatory Visit: Payer: 59 | Admitting: Obstetrics and Gynecology

## 2022-10-23 LAB — HM DIABETES EYE EXAM

## 2022-12-20 ENCOUNTER — Encounter: Payer: Self-pay | Admitting: Physician Assistant

## 2023-12-04 ENCOUNTER — Other Ambulatory Visit: Payer: Self-pay | Admitting: Internal Medicine

## 2023-12-04 ENCOUNTER — Encounter: Payer: Self-pay | Admitting: Internal Medicine

## 2023-12-04 ENCOUNTER — Ambulatory Visit: Payer: BLUE CROSS/BLUE SHIELD | Admitting: Internal Medicine

## 2023-12-04 VITALS — BP 124/86 | HR 76 | Temp 98.5°F | Ht 60.0 in | Wt 199.0 lb

## 2023-12-04 DIAGNOSIS — J410 Simple chronic bronchitis: Secondary | ICD-10-CM

## 2023-12-04 DIAGNOSIS — N6001 Solitary cyst of right breast: Secondary | ICD-10-CM

## 2023-12-04 DIAGNOSIS — M150 Primary generalized (osteo)arthritis: Secondary | ICD-10-CM | POA: Diagnosis not present

## 2023-12-04 DIAGNOSIS — R21 Rash and other nonspecific skin eruption: Secondary | ICD-10-CM

## 2023-12-04 DIAGNOSIS — Z1211 Encounter for screening for malignant neoplasm of colon: Secondary | ICD-10-CM

## 2023-12-04 DIAGNOSIS — R7303 Prediabetes: Secondary | ICD-10-CM

## 2023-12-04 DIAGNOSIS — Z0000 Encounter for general adult medical examination without abnormal findings: Secondary | ICD-10-CM | POA: Diagnosis not present

## 2023-12-04 DIAGNOSIS — Z1231 Encounter for screening mammogram for malignant neoplasm of breast: Secondary | ICD-10-CM

## 2023-12-04 LAB — LIPID PANEL
Cholesterol: 198 mg/dL (ref 0–200)
HDL: 41.6 mg/dL (ref >39.00)
LDL Cholesterol: 137 mg/dL — ABNORMAL HIGH (ref 0–99)
NonHDL: 156.13
Total CHOL/HDL Ratio: 5
Triglycerides: 97 mg/dL (ref 0.0–149.0)
VLDL: 19.4 mg/dL (ref 0.0–40.0)

## 2023-12-04 LAB — COMPREHENSIVE METABOLIC PANEL
ALT: 9 U/L (ref 0–35)
AST: 15 U/L (ref 0–37)
Albumin: 4 g/dL (ref 3.5–5.2)
Alkaline Phosphatase: 92 U/L (ref 39–117)
BUN: 23 mg/dL (ref 6–23)
CO2: 26 meq/L (ref 19–32)
Calcium: 9.4 mg/dL (ref 8.4–10.5)
Chloride: 108 meq/L (ref 96–112)
Creatinine, Ser: 0.81 mg/dL (ref 0.40–1.20)
GFR: 81.14 mL/min (ref >60.00)
Glucose, Bld: 91 mg/dL (ref 70–99)
Potassium: 4.1 meq/L (ref 3.5–5.1)
Sodium: 139 meq/L (ref 135–145)
Total Bilirubin: 0.4 mg/dL (ref 0.2–1.2)
Total Protein: 7 g/dL (ref 6.0–8.3)

## 2023-12-04 LAB — CBC
HCT: 38.7 % (ref 36.0–46.0)
Hemoglobin: 12.6 g/dL (ref 12.0–15.0)
MCHC: 32.4 g/dL (ref 30.0–36.0)
MCV: 88.8 fL (ref 78.0–100.0)
Platelets: 255 10*3/uL (ref 150.0–400.0)
RBC: 4.36 Mil/uL (ref 3.87–5.11)
RDW: 14.7 % (ref 11.5–15.5)
WBC: 6.5 10*3/uL (ref 4.0–10.5)

## 2023-12-04 LAB — HEMOGLOBIN A1C: Hgb A1c MFr Bld: 6.6 % — ABNORMAL HIGH (ref 4.6–6.5)

## 2023-12-04 NOTE — Progress Notes (Unsigned)
   Subjective:   Patient ID: Kylie Brennan, female    DOB: 07-Jul-1966, 57 y.o.   MRN: [id number]  HPI The patient is a new 57 YO female coming in for rash on arms and ongoing care of past medical conditions not checked in some time due to lack of medical insurance as well as wanting physical/routine care.   PMH, [facility], social history reviewed and updated  Review of Systems  Constitutional: Negative.   HENT: Negative.    Eyes: Negative.   Respiratory:  Negative for cough, chest tightness and shortness of breath.   Cardiovascular:  Negative for chest pain, palpitations and leg swelling.  Gastrointestinal:  Negative for abdominal distention, abdominal pain, constipation, diarrhea, nausea and vomiting.  Musculoskeletal:  Positive for arthralgias.  Skin:  Positive for rash.  Neurological: Negative.   Psychiatric/Behavioral: Negative.      Objective:  Physical Exam Constitutional:      Appearance: She is well-developed.  HENT:     Head: Normocephalic and atraumatic.  Cardiovascular:     Rate and Rhythm: Normal rate and regular rhythm.  Pulmonary:     Effort: Pulmonary effort is normal. No respiratory distress.     Breath sounds: Normal breath sounds. No wheezing or rales.  Abdominal:     General: Bowel sounds are normal. There is no distension.     Palpations: Abdomen is soft.     Tenderness: There is no abdominal tenderness. There is no rebound.  Musculoskeletal:        General: Tenderness present.     Cervical back: Normal range of motion.  Skin:    General: Skin is warm and dry.     Findings: Rash present.  Neurological:     Mental Status: She is alert and oriented to person, place, and time.     Coordination: Coordination normal.     Vitals:   12/04/23 0839  BP: 124/86  Pulse: 76  Temp: 98.5 F (36.9 C)  TempSrc: Oral  SpO2: 96%  Weight: 199 lb (90.3 kg)  Height: 5' (1.524 m)    Assessment & Plan:

## 2023-12-04 NOTE — Patient Instructions (Addendum)
We will check the labs today and send the colon cancer screening to you in the mail. They will call you to schedule the mammogram.

## 2023-12-05 DIAGNOSIS — R21 Rash and other nonspecific skin eruption: Secondary | ICD-10-CM | POA: Insufficient documentation

## 2023-12-05 DIAGNOSIS — M1990 Unspecified osteoarthritis, unspecified site: Secondary | ICD-10-CM | POA: Insufficient documentation

## 2023-12-05 MED ORDER — TRIAMCINOLONE ACETONIDE 0.1 % EX CREA: 1.0000 | TOPICAL_CREAM | Freq: Two times a day (BID) | CUTANEOUS | 0 refills | Status: AC

## 2023-12-05 NOTE — Assessment & Plan Note (Signed)
Using celebrex in the past has changed diet dramatically and does not use now but sees orthopedics at times for injections.

## 2023-12-05 NOTE — Assessment & Plan Note (Signed)
Appears to have some dry patches. She states diagnosed with psoriasis/psoriatic arthritis in the past will get records to review. Rx triamcinolone ointment.

## 2023-12-05 NOTE — Assessment & Plan Note (Signed)
Flu shot declines. Shingrix declines. Tetanus declines. Cologuard ordered. Mammogram ordered, pap smear due soon. Counseled about sun safety and mole surveillance. Counseled about the dangers of distracted driving. Given 10 year screening recommendations.

## 2023-12-05 NOTE — Assessment & Plan Note (Signed)
Checking HgA1c and adjust as needed.  

## 2023-12-05 NOTE — Assessment & Plan Note (Signed)
From past smoking and quit about 4-5 years ago. Congratulated on this and encouraged lifelong cessation. Uses albuterol prn and mostly just gets colds they go to her chest quicker.

## 2023-12-05 NOTE — Assessment & Plan Note (Signed)
With arthritis and pre-diabetes. Counseled about diet and exercise she has been on track with diet and down some weight recently.

## 2023-12-09 ENCOUNTER — Telehealth: Payer: Self-pay | Admitting: Internal Medicine

## 2023-12-09 NOTE — Telephone Encounter (Signed)
Patient called back concerning labs - she wants to see the nutritionist and she will follow up in 3 months

## 2023-12-10 ENCOUNTER — Other Ambulatory Visit: Payer: Self-pay | Admitting: Internal Medicine

## 2023-12-10 DIAGNOSIS — E119 Type 2 diabetes mellitus without complications: Secondary | ICD-10-CM

## 2023-12-10 MED ORDER — PRAVASTATIN SODIUM 20 MG PO TABS: 20.0000 mg | ORAL_TABLET | Freq: Every day | ORAL | 3 refills | Status: AC

## 2023-12-10 NOTE — Telephone Encounter (Signed)
This has been addressed in result notes. 

## 2023-12-26 ENCOUNTER — Encounter: Payer: BLUE CROSS/BLUE SHIELD | Attending: Internal Medicine | Admitting: Dietician

## 2023-12-26 VITALS — Ht 59.0 in | Wt 201.0 lb

## 2023-12-26 DIAGNOSIS — E119 Type 2 diabetes mellitus without complications: Secondary | ICD-10-CM | POA: Diagnosis present

## 2023-12-31 ENCOUNTER — Encounter: Payer: Self-pay | Admitting: Dietician

## 2023-12-31 NOTE — Progress Notes (Signed)
 Patient was seen on 12/26/23 for the first of a series of three diabetes self-management courses at [facility].  Patient Education Plan per assessed needs and concerns is to attend three course education program for Diabetes Self Management Education.  A1C was 6.6 on 12/04/23.  The following learning objectives were met by the patient during this class: Describe diabetes, types of diabetes and pathophysiology State some common risk factors for diabetes Defines the role of glucose and insulin Describe the relationship between diabetes and cardiovascular and other risks State the members of the Healthcare Team States the rationale for glucose monitoring and when to test State their individual Target Range State the importance of logging glucose readings and how to interpret the readings Identifies A1C target Explain the correlation between A1c and eAG values State symptoms and treatment of high blood glucose and low blood glucose Explain proper technique for glucose testing and identify proper sharps disposal  Handouts given during class include: How to Thrive:  A Guide for Your Journey with Diabetes by the ADA Meal Plan Card and carbohydrate content list Dietary intake form Low Sodium Flavoring Tips Types of Fats Dining Out Label reading Snack list The diabetes portion plate Diabetes Resources A1c to eAG Conversion Chart Blood Glucose Log Diabetes Recommended Care Schedule Support Group Diabetes Success Plan Core Class Satisfaction Survey   Follow-Up Plan: Attend core 2

## 2024-01-02 ENCOUNTER — Encounter: Payer: BLUE CROSS/BLUE SHIELD | Admitting: Dietician

## 2024-01-02 ENCOUNTER — Encounter: Payer: Self-pay | Admitting: Dietician

## 2024-01-02 DIAGNOSIS — E119 Type 2 diabetes mellitus without complications: Secondary | ICD-10-CM

## 2024-01-02 LAB — COLOGUARD: COLOGUARD: NEGATIVE

## 2024-01-03 ENCOUNTER — Encounter: Payer: Self-pay | Admitting: Internal Medicine

## 2024-01-03 LAB — COLOGUARD: Cologuard: NEGATIVE

## 2024-01-06 ENCOUNTER — Encounter: Payer: Self-pay | Admitting: Dietician

## 2024-01-06 NOTE — Progress Notes (Signed)
 Patient was seen on 01/01/2023 for the second of a series of three diabetes self-management courses at the [facility]. The following learning objectives were met by the patient during this class:  Describe the role of different macronutrients on glucose Explain how carbohydrates affect blood glucose State what foods contain the most carbohydrates Demonstrate carbohydrate counting Demonstrate how to read Nutrition Facts food label Describe effects of various fats on heart health Describe the importance of good nutrition for health and healthy eating strategies Describe techniques for managing your shopping, cooking and meal planning List strategies to follow meal plan when dining out Describe the effects of alcohol on glucose and how to use it safely  Goals:  Follow Diabetes Meal Plan as instructed  Aim to spread carbs evenly throughout the day  Aim for 3 meals per day and snacks as needed Include lean protein foods to meals/snacks  Monitor glucose levels as instructed by your doctor   Follow-Up Plan: Attend Core 3 Work towards following your personal food plan.

## 2024-01-09 ENCOUNTER — Encounter: Payer: BLUE CROSS/BLUE SHIELD | Attending: Internal Medicine | Admitting: Dietician

## 2024-01-09 ENCOUNTER — Encounter: Payer: Self-pay | Admitting: Dietician

## 2024-01-09 DIAGNOSIS — E119 Type 2 diabetes mellitus without complications: Secondary | ICD-10-CM | POA: Insufficient documentation

## 2024-01-10 ENCOUNTER — Encounter: Payer: Self-pay | Admitting: Dietician

## 2024-01-10 ENCOUNTER — Ambulatory Visit: Admission: RE | Admit: 2024-01-10 | Payer: BLUE CROSS/BLUE SHIELD | Source: Ambulatory Visit

## 2024-01-10 ENCOUNTER — Ambulatory Visit: Payer: BLUE CROSS/BLUE SHIELD

## 2024-01-10 NOTE — Progress Notes (Signed)
Patient was seen on 01/08/2023 for the third of a series of three diabetes self-management courses at the [facility].   State the amount of activity recommended for healthy living Describe activities suitable for individual needs Identify ways to regularly incorporate activity into daily life Identify barriers to activity and ways to over come these barriers Identify diabetes medications being personally used and their primary action for lowering glucose and possible side effects Describe role of stress on blood glucose and develop strategies to address psychosocial issues Identify diabetes complications and ways to prevent them Explain how to manage diabetes during illness Evaluate success in meeting personal goal Establish 2-3 goals that they will plan to diligently work on  Goals:  I will count my carb choices at most meals and snacks I will be active 30 minutes or more 3 times a week  Your patient has identified these potential barriers to change:  Motivation  Your patient has identified their diabetes self-care support plan as  On-line Resources [facility] Support Group     Plan:  Attend Support Group as desired

## 2024-03-06 ENCOUNTER — Ambulatory Visit: Payer: BLUE CROSS/BLUE SHIELD | Admitting: Internal Medicine

## 2024-04-03 ENCOUNTER — Ambulatory Visit: Payer: BLUE CROSS/BLUE SHIELD | Admitting: Dietician

## 2024-12-16 ENCOUNTER — Other Ambulatory Visit: Payer: Self-pay | Admitting: Internal Medicine
